# Patient Record
Sex: Female | Born: 1935 | Race: Black or African American | Hispanic: No | Smoking: Current every day smoker
Health system: Southern US, Community
[De-identification: ages and names within clinical notes are randomized; demographics above are authoritative.]

## PROBLEM LIST (undated history)

## (undated) DIAGNOSIS — I10 Essential (primary) hypertension: Secondary | ICD-10-CM

## (undated) DIAGNOSIS — Z8669 Personal history of other diseases of the nervous system and sense organs: Secondary | ICD-10-CM

## (undated) DIAGNOSIS — K5790 Diverticulosis of intestine, part unspecified, without perforation or abscess without bleeding: Secondary | ICD-10-CM

## (undated) DIAGNOSIS — Z9889 Other specified postprocedural states: Secondary | ICD-10-CM

## (undated) DIAGNOSIS — E785 Hyperlipidemia, unspecified: Secondary | ICD-10-CM

## (undated) DIAGNOSIS — K219 Gastro-esophageal reflux disease without esophagitis: Secondary | ICD-10-CM

## (undated) DIAGNOSIS — K5909 Other constipation: Secondary | ICD-10-CM

## (undated) HISTORY — DX: Personal history of other diseases of the nervous system and sense organs: Z86.69

## (undated) HISTORY — PX: OTHER SURGICAL HISTORY: SHX169

## (undated) HISTORY — PX: APPENDECTOMY: SHX54

## (undated) HISTORY — PX: ANKLE SURGERY: SHX546

## (undated) HISTORY — DX: Diverticulosis of intestine, part unspecified, without perforation or abscess without bleeding: K57.90

## (undated) HISTORY — DX: Hyperlipidemia, unspecified: E78.5

## (undated) HISTORY — DX: Other constipation: K59.09

## (undated) HISTORY — DX: Other specified postprocedural states: Z98.890

## (undated) HISTORY — DX: Essential (primary) hypertension: I10

## (undated) HISTORY — PX: TRIGGER FINGER RELEASE: SHX641

## (undated) HISTORY — DX: Gastro-esophageal reflux disease without esophagitis: K21.9

---

## 1983-04-28 HISTORY — PX: BACK SURGERY: SHX140

## 1988-12-26 HISTORY — PX: TOTAL HIP ARTHROPLASTY: SHX124

## 2005-06-06 ENCOUNTER — Inpatient Hospital Stay (HOSPITAL_COMMUNITY): Admission: EM | Admit: 2005-06-06 | Discharge: 2005-06-10 | Payer: Self-pay | Admitting: Emergency Medicine

## 2009-11-15 ENCOUNTER — Inpatient Hospital Stay (HOSPITAL_COMMUNITY): Admission: EM | Admit: 2009-11-15 | Discharge: 2009-11-18 | Payer: Self-pay | Admitting: Emergency Medicine

## 2009-12-03 ENCOUNTER — Emergency Department (HOSPITAL_COMMUNITY): Admission: EM | Admit: 2009-12-03 | Discharge: 2009-12-03 | Payer: Self-pay | Admitting: Emergency Medicine

## 2010-03-05 ENCOUNTER — Emergency Department (HOSPITAL_COMMUNITY): Admission: EM | Admit: 2010-03-05 | Discharge: 2010-03-05 | Payer: Self-pay | Admitting: Emergency Medicine

## 2010-03-11 ENCOUNTER — Ambulatory Visit: Payer: Self-pay | Admitting: Gastroenterology

## 2010-03-11 ENCOUNTER — Encounter: Payer: Self-pay | Admitting: Gastroenterology

## 2010-03-11 DIAGNOSIS — K625 Hemorrhage of anus and rectum: Secondary | ICD-10-CM

## 2010-03-11 DIAGNOSIS — K5900 Constipation, unspecified: Secondary | ICD-10-CM | POA: Insufficient documentation

## 2010-03-11 DIAGNOSIS — K6289 Other specified diseases of anus and rectum: Secondary | ICD-10-CM

## 2010-03-18 ENCOUNTER — Ambulatory Visit: Payer: Self-pay | Admitting: Internal Medicine

## 2010-03-18 ENCOUNTER — Encounter: Payer: Self-pay | Admitting: Gastroenterology

## 2010-03-19 ENCOUNTER — Ambulatory Visit (HOSPITAL_COMMUNITY)
Admission: RE | Admit: 2010-03-19 | Discharge: 2010-03-19 | Payer: Self-pay | Source: Home / Self Care | Admitting: Psychiatry

## 2010-03-27 DIAGNOSIS — K5790 Diverticulosis of intestine, part unspecified, without perforation or abscess without bleeding: Secondary | ICD-10-CM

## 2010-03-27 DIAGNOSIS — Z9889 Other specified postprocedural states: Secondary | ICD-10-CM

## 2010-03-27 HISTORY — DX: Diverticulosis of intestine, part unspecified, without perforation or abscess without bleeding: K57.90

## 2010-03-27 HISTORY — DX: Other specified postprocedural states: Z98.890

## 2010-04-04 ENCOUNTER — Ambulatory Visit (HOSPITAL_COMMUNITY)
Admission: RE | Admit: 2010-04-04 | Discharge: 2010-04-04 | Payer: Self-pay | Source: Home / Self Care | Attending: Gastroenterology | Admitting: Gastroenterology

## 2010-04-04 HISTORY — PX: COLONOSCOPY: SHX5424

## 2010-05-15 ENCOUNTER — Encounter (INDEPENDENT_AMBULATORY_CARE_PROVIDER_SITE_OTHER): Payer: Self-pay | Admitting: *Deleted

## 2010-05-27 ENCOUNTER — Encounter: Payer: Self-pay | Admitting: Urgent Care

## 2010-05-27 NOTE — Letter (Signed)
Summary: TCS ORDER  TCS ORDER   Imported By: Ave Filter 03/18/2010 11:07:15  _____________________________________________________________________  External Attachment:    Type:   Image     Comment:   External Document

## 2010-05-27 NOTE — Letter (Signed)
Summary: ABD X-RAY ORDER  ABD X-RAY ORDER   Imported By: Ave Filter 03/18/2010 16:12:53  _____________________________________________________________________  External Attachment:    Type:   Image     Comment:   External Document

## 2010-05-27 NOTE — Letter (Signed)
Summary: BOWEL PREP Victoria Klein  BOWEL PREP W/MIRALAX   Imported By: Rexene Alberts 03/11/2010 12:23:47  _____________________________________________________________________  External Attachment:    Type:   Image     Comment:   External Document  Appended Document: BOWEL PREP W/MIRALAX Pt needs to start clear liquids after lunch on 11/20. Please call her and let her know.  Appended Document: BOWEL PREP W/MIRALAX Discussed with [facility]. No need to start clear liquids. Patient has f/u OV for next week. Plans to schedule TCS at that time. Thanks.  Appended Document: 1WK F/U PER LL RECTAL PAIN/CONSTIPATION/LAW Please have pt get an abdominal xray (1 view) today. Reason: obstipation thanks anna   Appended Document: 1WK F/U PER LL RECTAL PAIN/CONSTIPATION/LAW Called pt and she stated she will go tomorrow. I faxed order to Radiology.

## 2010-05-29 NOTE — Letter (Addendum)
Summary: Recall Office Visit  [facility]  [address]   [place], [place] [address]   Phone: [phone]  Fax: [phone]      May 15, 2010   SHIVONNE SCHWARTZMAN [address] [place], [place]  [address] [birth year]   Dear Ms. Hector,   According to our records, it is time for you to schedule a follow-up office visit with [place].   At your convenience, please call [phone] to schedule an office visit. If you have any questions, concerns, or feel that this letter is in error, we would appreciate your call.   Sincerely,    Diana Eves  [facility] Ph: [phone]   Fax: [phone]  Appended Document: Recall Office Visit pt has appt for 06/17/10 @ 11am w/KJ. pt asked to mail her appt card

## 2010-05-29 NOTE — Assessment & Plan Note (Signed)
Summary: 1WK F/U PER LL RECTAL PAIN/CONSTIPATION/LAW   Visit Type:  Follow-up Visit Primary Care Provider:   Dr. Brion Aliment in [place]   CC:  F/U rectal pain/constipation.  History of Present Illness: Victoria Klein presents today for a one week follow-up. She was seen last week after presenting to [facility] ED secondary to rectal pain and bleeding, constipation. heme+ on DRE. had soft impaction noted last visit, refused to be disimpacted. Given Miralax prep, reportedly took all except fleets enema portion. Continued rectal pain, urge for BM. Has had small amounts of stool pass daily, but she reports still feels impacted. Denies abdominal pain.    Current Medications (verified): 1)  Hydrochlorothiazide 25 Mg Tabs (Hydrochlorothiazide) .... Once Daily 2)  Niaspan 500 Mg Cr-Tabs (Niacin (Antihyperlipidemic)) .... Once Daily 3)  Pravachol 20 Mg Tabs (Pravastatin Sodium) .... Once Daily 4)  Aspirin 325 Mg Tabs (Aspirin) .... Once Daily 5)  Fish Oil 1000 Mg Caps (Omega-3 Fatty Acids) .... Once Daily 6)  Polyethylene Glycol 3350  Powd (Polyethylene Glycol 3350) .Marland KitchenMarland KitchenMarland Kitchen 17 Grams By Mouth Daily For Constipation 7)  Polyethylene Glycol 3350  Powd (Polyethylene Glycol 3350) .... See Bowel Prep Instruction Sheet 8)  Lidazone Hc 3-0.5 % Crea (Lidocaine-Hydrocortisone Ace) .... Apply Anorectally Two Times A Day For Two Weeks  Allergies (verified): No Known Drug Allergies  Review of Systems General:  Denies fever, chills, and anorexia. Eyes:  Denies blurring, irritation, and discharge. ENT:  Denies sore throat, hoarseness, and difficulty swallowing. CV:  Denies chest pains and syncope. Resp:  Denies dyspnea at rest and wheezing. GI:  Complains of constipation; denies difficulty swallowing, pain on swallowing, and nausea. GU:  Denies urinary burning and blood in urine. MS:  Denies joint pain / LOM, joint swelling, and joint stiffness. Derm:  Denies rash, itching, and dry skin. Neuro:  Denies weakness  and syncope. Psych:  Denies depression and anxiety.  Vital Signs:  Patient profile:   77 year old female Height:      66 inches Weight:      178 pounds BMI:     28.83 Temp:     98.1 degrees F oral Pulse rate:   96 / minute BP sitting:   168 / 96  (left arm) Cuff size:   large  Vitals Entered By: Cloria Spring LPN (March 18, 2010 10:17 AM)  Physical Exam  General:  Well developed, well nourished, no acute distress. Lungs:  Clear throughout to auscultation. Heart:  Regular rate and rhythm; no murmurs, rubs,  or bruits. Abdomen:  Soft, nontender and nondistended. No masses, hepatosplenomegaly or hernias noted. Normal bowel sounds. Rectal:  DRE: small skin tag, very limited evaluation of anal canal secondary to increased discomfort. able to appreciate possible impaction; however, pt was extremely uncomfortable.  Impression & Recommendations:  Problem # 1:  CONSTIPATION (ICD-50.22)  77 year old female with significant constipation, worsening over past 2-3 weeks, here for f/u after miralax prep with poor results. continued feeling of inadequate BMs, small amount produced. +rectal pain, discomfort with defecation. No abdominal pain or bloating. No further rectal bleeding. difficult to assess degree of impaction secondary to painful DRE. likely rectal pain r/t impaction, possible anal fissure, possible rectal prolapse.  Will obtain abd xray to assess due to obstipation Set up for TCS with Dr. Darrick Penna   Orders: Est. Patient Level III [phone])  Problem # 2:  ANAL OR RECTAL PAIN (UEA-540.98)  see #1  Orders: Est. Patient Level III (11914)  Problem # 3:  RECTAL  BLEEDING (ICD-569.3)  see #1  Orders: Est. Patient Level III (47425)   Appended Document: 1WK F/U PER LL RECTAL PAIN/CONSTIPATION/LAW Please have pt get an abdominal xray (1 view) today. Reason: obstipation thanks Victoria Klein   Appended Document: 1WK F/U PER LL RECTAL PAIN/CONSTIPATION/LAW Called pt and she stated she  will go tomorrow. I faxed order to Radiology.

## 2010-05-29 NOTE — Assessment & Plan Note (Signed)
Summary: rectal bleeding.hemorrhoids.constipated/ss   Visit Type:  consult Referring Provider:  [facility] Primary Care Provider:   Dr. Brion Aliment in [place]   CC:  rectal bleeding, hemorroids, and constipation.  History of Present Illness: Victoria Klein is a pleasnat 77 y/o AA female who presents for further evaluation of rectal bleeding, rectal pain, constipation at the request of Dr. Donnetta Hutching at [facility] ED.  H/O intermittent constipation off/on for years but nothing "real bad". Two weeks of started to have bad constipation. Has not had "good" BM in 2 weeks. Went to ED on 03/05/10 for rectal pain and rectal bleeding. DRE showed heme positive stool but no other reported abnormality. Her H/H was 11.7/36.2, MCV 78.8. In 7/11 her folate was 9.3, B12 >2000. She was given Colace (took for few days but stopped). Was not able to try anusol supp due to pain. Drank bottle of Mag Citrate last Thursday and no results. No stool softners this week. No other laxatives.  Has had blood in stool and on toliet paper along with small clots. Some blood (little) still this morning in stool. Lots of rectal pain. Has constant urge to have BM but only little comes out and associated with worsening rectal pain. Hard to sit due to pain. Up and down all night last night due to urge but no results. No abd pain. No appetite. No vomiting. No heartburn or dysphagia. Intentional weight loss approx 30 pounds or more.  Current Medications (verified): 1)  Hydrochlorothiazide 25 Mg Tabs (Hydrochlorothiazide) .... Once Daily 2)  Niaspan 500 Mg Cr-Tabs (Niacin (Antihyperlipidemic)) .... Once Daily 3)  Pravachol 20 Mg Tabs (Pravastatin Sodium) .... Once Daily 4)  Aspirin 325 Mg Tabs (Aspirin) .... Once Daily 5)  Fish Oil 1000 Mg Caps (Omega-3 Fatty Acids) .... Once Daily  Allergies (verified): No Known Drug Allergies  Past History:  Past Medical History: H/O Bell's palsy GERD, no longer on  medications Hyperlipidemia Hypertension  Past Surgical History: Repair of the right ankle.  Back surgery 1985.  Appendectomy.  Trigger release surgery.  Tubal ligation.  Total hip arthroplasty in the 1990s.   Family History: Mom is deceased with a history of coronary artery disease and diabetes.  Dad deceased with a history of prostate cancer. Daughter had breast cancer, deceased age 79. No FH of CRC.   Social History: Smoked half a pack of cigarettes per day for 37 yrs, quit in 7/11.  Does not drink.  No illicit drug use.  She used to work at [employer].  Has a supportive family.    Review of Systems General:  Denies fever, chills, sweats, anorexia, fatigue, weakness, and weight loss. Eyes:  Denies vision loss. ENT:  Denies sore throat, hoarseness, and difficulty swallowing. CV:  Denies chest pains, angina, palpitations, dyspnea on exertion, and peripheral edema. Resp:  Denies dyspnea at rest, dyspnea with exercise, cough, sputum, and wheezing. GI:  See HPI. GU:  Denies urinary burning and blood in urine. MS:  Denies joint pain / LOM. Derm:  Denies rash and itching. Neuro:  Denies weakness, frequent headaches, memory loss, and confusion. Psych:  Denies depression and anxiety. Endo:  Denies unusual weight change. Heme:  Denies bruising and bleeding. Allergy:  Denies hives and rash.  Vital Signs:  Patient profile:   77 year old female Height:      66 inches Weight:      178 pounds BMI:     28.83 Temp:     97.6 degrees F oral  Pulse rate:   92 / minute BP sitting:   158 / 100  (left arm) Cuff size:   regular  Vitals Entered By: Hendricks Limes LPN (March 11, 2010 10:57 AM)  Physical Exam  General:  Well developed, well nourished, appears uncomfortable. Head:  Normocephalic and atraumatic. Eyes:  Conjunctivae pink, no scleral icterus.  Mouth:  Oropharyngeal mucosa moist, pink.  No lesions, erythema or exudate.    Neck:  Supple; no masses or thyromegaly. Lungs:   Clear throughout to auscultation. Heart:  Regular rate and rhythm; no murmurs, rubs,  or bruits. Abdomen:  Bowel sounds normal.  Abdomen is soft, nontender, nondistended.  No rebound or guarding.  No hepatosplenomegaly, masses or hernias.  No abdominal bruits.  Rectal:  Small skin tag externally. Moderate tenderness with internal exam. Moderate amount of soft stool in rectal vault. Tried to disimpact patient, but not tolerated by patient. No visible blood noted. Extremities:  No clubbing, cyanosis, edema or deformities noted. Neurologic:  Alert and  oriented x4;  grossly normal neurologically. Skin:  Intact without significant lesions or rashes. Cervical Nodes:  No significant cervical adenopathy. Psych:  Alert and cooperative. Normal mood and affect.  Impression & Recommendations:  Problem # 1:  CONSTIPATION (ICD-564.00)  Significant constipation associated with rectal pain and rectal bleeding. Soft impaction on exam. Patient has not taken any laxatives in five days. No abd pain. Discussed with Dr. Darrick Penna. Will start bowel prep today to relieve constipation. Patient refused digital disimpaction. See patient instructions. If no relief and pain worsens, she should go to ED or PCP.  OV next week. Will schedule TCS at that time.  Orders: Consultation Level IV ([address])  Problem # 2:  ANAL OR RECTAL PAIN (UEA-540.98)  Rectal pain likely due to impaction +/- anal fissure. Start lidocaine/hydrocortisone cream. Management of constipation as above. TCS in near future. Will schedule after next weeks OV.   Orders: Consultation Level IV (119[address])  Problem # 3:  RECTAL BLEEDING (ICD-569.3)  see #1 and #2.   Orders: Consultation Level IV ([address])  Patient Instructions: 1)  Soft diet only until bowels are moving.  2)  Miralax prep (see separate sheet). Need to start as soon as possible.  3)  You will also continue Miralax one capful daily for constipation. 4)  You may take ibuprofen, two every 8  hours for next three days for pain. 5)  You may also use Tylenol (acetaminophen) as per box instructions for additional pain relief if needed. 6)  Strong pain medication should not be given/taken as it will worsen the constipation. 7)  Start lidocaine/hydrocortisone for your rectal pain, see RX. 8)  Take hot bath (Sitz bath) for rectal pain relief. 9)  Office visit in one week. We will schedule you for a colonoscopy at that time. 10)  The medication list was reviewed and reconciled.  All changed / newly prescribed medications were explained.  A complete medication list was provided to the patient / caregiver. Prescriptions: LIDAZONE HC 3-0.5 % CREA (LIDOCAINE-HYDROCORTISONE ACE) apply anorectally two times a day for two weeks  #2 weeks x 0   Entered and Authorized by:   Leanna Battles. Dixon Boos   Signed by:   Leanna Battles Lewis PA-C on 03/11/2010   Method used:   Print then Give to Patient   RxID:   [id number] POLYETHYLENE GLYCOL 3350  POWD (POLYETHYLENE GLYCOL 3350) see bowel prep instruction sheet  #238 gram x 0   Entered and Authorized by:  Leanna Battles. Dixon Boos   Signed by:   Leanna Battles Lewis PA-C on 03/11/2010   Method used:   Print then Give to Patient   RxID:   [id number] POLYETHYLENE GLYCOL 3350  POWD (POLYETHYLENE GLYCOL 3350) 17 grams by mouth daily for constipation  #527 x 5   Entered and Authorized by:   Leanna Battles. Dixon Boos   Signed by:   Leanna Battles Lewis PA-C on 03/11/2010   Method used:   Print then Give to Patient   RxID:   [id number]  I would like to thank Dr. Adriana Simas for allowing [place] to take care in the part of this nice patient.   Appended Document: rectal bleeding.hemorrhoids.constipated/ss Differential diagnosis also includes rectal prolapse.  Appended Document: rectal bleeding.hemorrhoids.constipated/ss 1WK F/U IS IN THE COMPUTER

## 2010-06-04 NOTE — Medication Information (Signed)
Summary: DOCUSATE SODIUM 100MG   DOCUSATE SODIUM 100MG    Imported By: Rexene Alberts 05/27/2010 11:10:33  _____________________________________________________________________  External Attachment:    Type:   Image     Comment:   External Document  Appended Document: DOCUSATE SODIUM 100MG     Prescriptions: DOCUSATE SODIUM 100 MG CAPS (DOCUSATE SODIUM) 1 by mouth bid  #60 x 5   Entered and Authorized by:   Joselyn Arrow FNP-BC   Signed by:   Joselyn Arrow FNP-BC on 05/27/2010   Method used:   Electronically to        [facility] (retail)       [address]       [place], [place]  [address]       Ph: [phone]       Fax: [phone]   RxID:   [id number]

## 2010-06-17 ENCOUNTER — Ambulatory Visit: Payer: Self-pay | Admitting: Urgent Care

## 2010-07-04 ENCOUNTER — Encounter: Payer: Self-pay | Admitting: Urgent Care

## 2010-07-08 LAB — DIFFERENTIAL
Lymphs Abs: 2 10*3/uL (ref 0.7–4.0)
Monocytes Absolute: 0.7 10*3/uL (ref 0.1–1.0)
Monocytes Relative: 8 % (ref 3–12)
Neutro Abs: 5.3 10*3/uL (ref 1.7–7.7)
Neutrophils Relative %: 64 % (ref 43–77)

## 2010-07-08 LAB — CBC
HCT: 36.2 % (ref 36.0–46.0)
Hemoglobin: 11.7 g/dL — ABNORMAL LOW (ref 12.0–15.0)
RBC: 4.59 MIL/uL (ref 3.87–5.11)

## 2010-07-08 LAB — BASIC METABOLIC PANEL
CO2: 27 mEq/L (ref 19–32)
Calcium: 9.2 mg/dL (ref 8.4–10.5)
GFR calc Af Amer: >60 mL/min (ref >60)
GFR calc non Af Amer: >60 mL/min (ref >60)
Potassium: 3.1 mEq/L — ABNORMAL LOW (ref 3.5–5.1)
Sodium: 138 mEq/L (ref 135–145)

## 2010-07-12 LAB — DIFFERENTIAL
Basophils Absolute: 0 10*3/uL (ref 0.0–0.1)
Basophils Absolute: 0 10*3/uL (ref 0.0–0.1)
Basophils Relative: 0 % (ref 0–1)
Basophils Relative: 1 % (ref 0–1)
Eosinophils Absolute: 0.1 10*3/uL (ref 0.0–0.7)
Eosinophils Absolute: 0.2 10*3/uL (ref 0.0–0.7)
Eosinophils Absolute: 0.3 10*3/uL (ref 0.0–0.7)
Eosinophils Relative: 1 % (ref 0–5)
Eosinophils Relative: 2 % (ref 0–5)
Eosinophils Relative: 3 % (ref 0–5)
Lymphocytes Relative: 31 % (ref 12–46)
Lymphocytes Relative: 39 % (ref 12–46)
Lymphs Abs: 2.2 10*3/uL (ref 0.7–4.0)
Lymphs Abs: 3.1 10*3/uL (ref 0.7–4.0)
Lymphs Abs: 3.3 10*3/uL (ref 0.7–4.0)
Monocytes Absolute: 0.7 10*3/uL (ref 0.1–1.0)
Monocytes Absolute: 0.8 10*3/uL (ref 0.1–1.0)
Monocytes Absolute: 1 10*3/uL (ref 0.1–1.0)
Monocytes Relative: 8 % (ref 3–12)
Monocytes Relative: 8 % (ref 3–12)
Monocytes Relative: 9 % (ref 3–12)
Neutro Abs: 4.2 10*3/uL (ref 1.7–7.7)
Neutro Abs: 5.9 10*3/uL (ref 1.7–7.7)
Neutrophils Relative %: 49 % (ref 43–77)
Neutrophils Relative %: 59 % (ref 43–77)

## 2010-07-12 LAB — CBC
HCT: 35.2 % — ABNORMAL LOW (ref 36.0–46.0)
HCT: 35.7 % — ABNORMAL LOW (ref 36.0–46.0)
HCT: 38 % (ref 36.0–46.0)
Hemoglobin: 11.6 g/dL — ABNORMAL LOW (ref 12.0–15.0)
Hemoglobin: 12.4 g/dL (ref 12.0–15.0)
MCH: 25.9 pg — ABNORMAL LOW (ref 26.0–34.0)
MCH: 26.2 pg (ref 26.0–34.0)
MCHC: 32.6 g/dL (ref 30.0–36.0)
MCHC: 32.7 g/dL (ref 30.0–36.0)
MCHC: 33.2 g/dL (ref 30.0–36.0)
MCV: 79.2 fL (ref 78.0–100.0)
MCV: 80.3 fL (ref 78.0–100.0)
Platelets: 335 10*3/uL (ref 150–400)
RBC: 4.45 MIL/uL (ref 3.87–5.11)
RBC: 4.8 MIL/uL (ref 3.87–5.11)
RDW: 13.9 % (ref 11.5–15.5)
RDW: 13.9 % (ref 11.5–15.5)
WBC: 8.6 10*3/uL (ref 4.0–10.5)

## 2010-07-12 LAB — POCT CARDIAC MARKERS
CKMB, poc: 1.5 ng/mL (ref 1.0–8.0)
CKMB, poc: 1.8 ng/mL (ref 1.0–8.0)
Myoglobin, poc: 92.5 ng/mL (ref 12–200)
Myoglobin, poc: 94.8 ng/mL (ref 12–200)
Troponin i, poc: <0.05 ng/mL (ref 0.00–0.09)
Troponin i, poc: <0.05 ng/mL (ref 0.00–0.09)

## 2010-07-12 LAB — URINALYSIS, ROUTINE W REFLEX MICROSCOPIC
Bilirubin Urine: NEGATIVE
Hgb urine dipstick: NEGATIVE
Ketones, ur: NEGATIVE mg/dL
Protein, ur: NEGATIVE mg/dL
Urobilinogen, UA: 0.2 mg/dL (ref 0.0–1.0)

## 2010-07-12 LAB — POCT I-STAT, CHEM 8
BUN: 6 mg/dL (ref 6–23)
Chloride: 100 mEq/L (ref 96–112)
Creatinine, Ser: 0.8 mg/dL (ref 0.4–1.2)
Hemoglobin: 13.9 g/dL (ref 12.0–15.0)
Potassium: 2.8 mEq/L — ABNORMAL LOW (ref 3.5–5.1)
Sodium: 139 mEq/L (ref 135–145)

## 2010-07-12 LAB — BASIC METABOLIC PANEL
BUN: 6 mg/dL (ref 6–23)
BUN: 7 mg/dL (ref 6–23)
BUN: 9 mg/dL (ref 6–23)
CO2: 25 mEq/L (ref 19–32)
CO2: 28 mEq/L (ref 19–32)
Calcium: 8.9 mg/dL (ref 8.4–10.5)
Calcium: 8.9 mg/dL (ref 8.4–10.5)
Chloride: 101 mEq/L (ref 96–112)
Chloride: 102 mEq/L (ref 96–112)
Creatinine, Ser: 0.67 mg/dL (ref 0.4–1.2)
Creatinine, Ser: 0.69 mg/dL (ref 0.4–1.2)
GFR calc Af Amer: >60 mL/min (ref >60)
GFR calc Af Amer: >60 mL/min (ref >60)
GFR calc non Af Amer: >60 mL/min (ref >60)
GFR calc non Af Amer: >60 mL/min (ref >60)
GFR calc non Af Amer: >60 mL/min (ref >60)
Glucose, Bld: 118 mg/dL — ABNORMAL HIGH (ref 70–99)
Glucose, Bld: 129 mg/dL — ABNORMAL HIGH (ref 70–99)
Glucose, Bld: 130 mg/dL — ABNORMAL HIGH (ref 70–99)
Potassium: 2.9 mEq/L — ABNORMAL LOW (ref 3.5–5.1)
Potassium: 3.7 mEq/L (ref 3.5–5.1)
Potassium: 3.8 mEq/L (ref 3.5–5.1)
Sodium: 133 mEq/L — ABNORMAL LOW (ref 135–145)
Sodium: 137 mEq/L (ref 135–145)

## 2010-07-12 LAB — LIPID PANEL
Cholesterol: 177 mg/dL (ref 0–200)
HDL: 43 mg/dL (ref >39)
LDL Cholesterol: 107 mg/dL — ABNORMAL HIGH (ref 0–99)
Total CHOL/HDL Ratio: 4.1 RATIO
Triglycerides: 133 mg/dL (ref <150)
VLDL: 27 mg/dL (ref 0–40)

## 2010-07-12 LAB — TSH: TSH: 1.266 u[IU]/mL (ref 0.350–4.500)

## 2010-07-12 LAB — URINE CULTURE

## 2010-07-12 LAB — PROTIME-INR: INR: 1.08 (ref 0.00–1.49)

## 2010-07-12 LAB — VITAMIN B12: Vitamin B-12: >2000 pg/mL — ABNORMAL HIGH (ref 211–911)

## 2010-07-14 ENCOUNTER — Encounter: Payer: Self-pay | Admitting: Urgent Care

## 2010-07-14 ENCOUNTER — Ambulatory Visit (INDEPENDENT_AMBULATORY_CARE_PROVIDER_SITE_OTHER): Payer: Medicare Other | Admitting: Urgent Care

## 2010-07-14 DIAGNOSIS — K6289 Other specified diseases of anus and rectum: Secondary | ICD-10-CM

## 2010-07-14 DIAGNOSIS — K5900 Constipation, unspecified: Secondary | ICD-10-CM

## 2010-07-24 NOTE — Assessment & Plan Note (Signed)
Summary: rectal pain   Vital Signs:  Patient profile:   77 year old female Height:      68 inches Weight:      191 pounds BMI:     29.15 Temp:     97.9 degrees F oral Pulse rate:   88 / minute BP sitting:   148 / 102  (left arm)  Vitals Entered By: Carolan Clines LPN (July 14, 2010 1:05 PM)  Visit Type:  Follow-up Visit Primary Care Provider:  [facility]  Chief Complaint:  FU fissure/constipation.  History of Present Illness:  77 year old black female here for followup constipation and proctalgia.  She has history of fissure.  Doing well except still w/ constipation BM q2 but only if she takes something.  Colace 2 by mouth two times a day.  MIralax did not help.  Trying fleets enemas q 2-3 days x 2-3 weeks.  Small amt dull red blood on occasion.  Some proctalgia.  Lots of straining w/ hard stools.  Wt up 13# in past 6 mo.   Overall feels well.   Denies any fever or chills.  Current Medications (verified): 1)  Hydrochlorothiazide 25 Mg Tabs (Hydrochlorothiazide) .... Once Daily 2)  Niaspan 500 Mg Cr-Tabs (Niacin (Antihyperlipidemic)) .... Once Daily 3)  Pravachol 20 Mg Tabs (Pravastatin Sodium) .... Once Daily 4)  Aspirin 325 Mg Tabs (Aspirin) .... Once Daily 5)  Fish Oil 1000 Mg Caps (Omega-3 Fatty Acids) .... Once Daily 6)  Polyethylene Glycol 3350  Powd (Polyethylene Glycol 3350) .Marland KitchenMarland KitchenMarland Kitchen 17 Grams By Mouth Daily For Constipation 7)  Polyethylene Glycol 3350  Powd (Polyethylene Glycol 3350) .... See Bowel Prep Instruction Sheet 8)  Lidazone Hc 3-0.5 % Crea (Lidocaine-Hydrocortisone Ace) .... Apply Anorectally Two Times A Day For Two Weeks 9)  Docusate Sodium 100 Mg Caps (Docusate Sodium) .Marland Kitchen.. 1 By Mouth Bid  Allergies (verified): No Known Drug Allergies  Past History:  Past Surgical History: Last updated: 03/11/2010 Repair of the right ankle.  Back surgery 1985.  Appendectomy.  Trigger release surgery.  Tubal ligation.  Total hip arthroplasty in  the 1990s.   Past Medical History: H/O Bell's palsy GERD, no longer on medications Hyperlipidemia Hypertension chronic constipation 12/11 colonoscopy Dr Georgiann Mohs fissure, exacerbated by constipation.   Possible nonsteroidal antiinflammatory drug-induced colitis, Mild diverticulosis    Review of Systems      See HPI General:  Denies fever, chills, sweats, anorexia, fatigue, weakness, malaise, weight loss, and sleep disorder. CV:  Denies chest pains, angina, palpitations, syncope, dyspnea on exertion, orthopnea, PND, peripheral edema, and claudication. Resp:  Denies dyspnea at rest, dyspnea with exercise, cough, sputum, wheezing, coughing up blood, and pleurisy. GI:  See HPI; Denies difficulty swallowing, pain on swallowing, indigestion/heartburn, vomiting blood, and fecal incontinence. GU:  Denies urinary burning, blood in urine, nocturnal urination, urinary frequency, urinary incontinence, and abnormal vaginal bleeding. MS:  Denies joint pain / LOM, joint swelling, joint stiffness, joint deformity, low back pain, muscle weakness, muscle cramps, muscle atrophy, leg pain at night, leg pain with exertion, and shoulder pain / LOM hand / wrist pain (CTS). Derm:  Denies rash, itching, dry skin, hives, moles, warts, and unhealing ulcers. Psych:  Denies depression, anxiety, memory loss, suicidal ideation, hallucinations, paranoia, phobia, and confusion. Heme:  Denies bruising, bleeding, and enlarged lymph nodes.  Physical Exam  General:  Well developed, well nourished, no acute distress. Head:  Normocephalic and atraumatic. Eyes:  Conjunctivae pink, no scleral icterus.  Mouth:  Oropharyngeal  mucosa moist, pink.  No lesions, erythema or exudate.    Neck:  Supple; no masses or thyromegaly. Lungs:  Clear throughout to auscultation. Heart:  Regular rate and rhythm; no murmurs, rubs,  or bruits. Abdomen:  Soft, nontender and nondistended. No masses, hepatosplenomegaly or hernias noted.  Normal bowel sounds.without guarding and without rebound.   Msk:  Symmetrical with no gross deformities. Normal posture. Extremities:  No clubbing, cyanosis, edema or deformities noted. Neurologic:  Alert and  oriented x4;  grossly normal neurologically. Skin:  Intact without significant lesions or rashes. Cervical Nodes:  No significant cervical adenopathy. Psych:  Alert and cooperative. Normal mood and affect.   Impression & Recommendations:  Problem # 1:  CONSTIPATION (ICD-564.00)  Ms. Mendel is a 77 year old black female with history of chronic constipation. She has had poor results with MiraLax. She is continued to take stool softeners several times per day. She is having to use fleets enema on a regular basis. We did discuss the fact that I do not want her taking fleets enemas every couple days. We will give a trial of Amitiza.  Orders: Est. Patient Level III (41324)  Problem # 2:  ANAL OR RECTAL PAIN (MWN-027.25) History of fissure occasional intermittent pain noted.   Suspect secondary to this versus hemorrhoids.  Patient Instructions: 1)   Discontinue MiraLax.  2)  Amitiza 24 micrograms one daily to begin may increase to twice a day with food as needed for constipation ( 2 boxes given) 3)   May continue Colace 100 mg twice a day. 4)  Please schedule a follow-up appointment in 6 to 8 weeks.  Prescriptions: ANUSOL-HC 25 MG SUPP (HYDROCORTISONE ACETATE) 1 PR two times a day x 10 days  #20 x 0   Entered and Authorized by:   Joselyn Arrow FNP-BC   Signed by:   Joselyn Arrow FNP-BC on 07/14/2010   Method used:   Electronically to        [facility] (retail)       [address]       [place], [place]  [address]       Ph: [phone]       Fax: [phone]   RxID:   [id number] AMITIZA 24 MCG CAPS (LUBIPROSTONE) 1 by mouth two times a day as needed constipation w/ food  #62 x 5   Entered and Authorized by:   Joselyn Arrow FNP-BC    Signed by:   Joselyn Arrow FNP-BC on 07/14/2010   Method used:   Electronically to        [facility] (retail)       [address]       [place], [place]  [address]       Ph: [phone]       Fax: [phone]   RxID:   [id number]    Orders Added: 1)  Est. Patient Level III [694[address]]  Appended Document: rectal pain F/U OPV IS IN THE COMPUTER

## 2010-09-03 ENCOUNTER — Ambulatory Visit: Payer: Medicare Other | Admitting: Gastroenterology

## 2010-09-12 NOTE — H&P (Signed)
Victoria Klein, Victoria Klein              ACCOUNT NO.:  [id number]   MEDICAL RECORD NO.:  [id number]          PATIENT TYPE:  EMS   LOCATION:  ED                            FACILITY:  [facility]   PHYSICIAN:  J. Darreld Mclean, M.D. DATE OF BIRTH:  03/29/1932   DATE OF ADMISSION:  06/06/2005  DATE OF DISCHARGE:  LH                                HISTORY & PHYSICAL   CHIEF COMPLAINT:  I fell down some steps and hurt my ankle.   The patient is a 77 year old female who fell down some steps this evening  and hurt her ankle on the right.  She had a displaced trimalleolar fracture  with large posterior fragment.  Reduced in the ER by Dr. Rosalia Hammers.  Post  reduction x-rays look very good.  No other injuries.   PAST MEDICAL HISTORY:  Positive for hypertension and gastroesophageal reflux  disease.  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   PREVIOUS SURGERIES:  1.  Back surgery in 1985.  2.  Appendectomy as a child.  3.  Bilateral tubal ligation many years ago.  4.  Two total hip procedures in 1990, early 1990 and late 1990, one on each      side.  5.  Trigger thumb release on the left hand by me many years ago.   Dr. Bethena Midget is her family doctor.  The patient lives in [place].   General medical health is otherwise good.   PHYSICAL EXAMINATION:  VITAL SIGNS:  Normal.  GENERAL:  She is alert, cooperative, and oriented.  HEENT: Negative.  NECK: Supple.  LUNGS: Clear to percussion and auscultation.  HEART:  Regular without murmur heard.  ABDOMEN:  Soft, nontender, without masses.  EXTREMITIES:  The right leg is in a cast.  Neurovascular is intact,  posterior splint.  Other extremities within normal limits.  CNS: Intact.  SKIN:  Intact.   IMPRESSION:  1.  Trimalleolar fracture on the right with large posterior fragment.  2.  Status post bilateral hip replacements.  3.  History of gastroesophageal reflux disease.  4.  History of hypertension.   PLAN:  I will have the hospitalists see the  patient.  Discussed with patient  the planned procedure, risks, and imponderables including infection,  stiffness of the ankle, traumatic arthritis, need for anesthesia,  recommended spinal anesthesia, physical therapy, and possible embolization.  The patient appears to understand and agrees to proceed as outlined.  Labs  are pending.                                            ______________________________  J. Darreld Mclean, M.D.     JWK/MEDQ  D:  06/06/2005  T:  06/06/2005  Job:  [id number]

## 2010-09-12 NOTE — Consult Note (Signed)
Victoria Klein, Victoria Klein              ACCOUNT NO.:  [id number]   MEDICAL RECORD NO.:  [id number]          PATIENT TYPE:  INP   LOCATION:  A331                          FACILITY:  [facility]   PHYSICIAN:  Osvaldo Shipper, MD     DATE OF BIRTH:  [birth year]   DATE OF CONSULTATION:  06/06/2005  DATE OF DISCHARGE:                                   CONSULTATION   MEDICAL CONSULTATION:   DATE OF CONSULTATION:  June 06, 2005   REQUESTING PHYSICIAN:  Dr. Hilda Lias   REASON FOR CONSULTATION:  Medical management and preoperative evaluation.   PRIMARY MEDICAL DOCTOR:  Dr. Bethena Midget at [place].   CHIEF COMPLAINT:  Right ankle pain.   HISTORY OF PRESENT ILLNESS:  The patient is a 77 year old African-American  female who has history of hypertension, dyslipidemia and acid reflux disease  who was doing well today when she was going down her stairs to pick up  groceries from her car when she missed a step and fell down.  She twisted  her right foot and as a result has a fracture of her right ankle.  This has  been described as a trimalleolar fracture with dislocation.  The patient  denied any syncopal episode prior to this event or after this event.  Denied  any chest pain.  Denied any shortness of breath.  Apart from that the  patient does not have any complaints.  She gives history of some nonspecific  chest pain which occurred about a year ago when she was on the treadmill  otherwise she is not able to describe this fully.  Since then she has not  had any pain.  She climbs this flight of stairs in her house with no  difficulties and does not get any chest symptoms with that.  She gives  history of a stress test about 3 years ago which was described as being  negative.  She describes herself as a fairly active person but is not on any  exercise regimen.  She does not need a cane or a walker to ambulate.   MEDICATIONS:  Medications at home include pravastatin, Prevacid,  hydrochlorothiazide, and aspirin 325 mg.   ALLERGIES:  No known drug allergies.   PAST MEDICAL HISTORY:  Past medical history significant for hypertension,  dyslipidemia, GERD, Bell's palsy about 20 years ago.  Surgical history  includes back surgery for some disk problems.  Bilateral hip replacement  surgeries.  Tubal ligation and an appendectomy in the past.  No history of  any heart disease or diabetes.  No history of strokes. Per patient, negative  stress test 3 yrs ago.   SOCIAL HISTORY:  The patient lives in [place] with her grandchildren and  daughter.  She does not need a cane or walker to ambulate.  She smokes about  half a pack of cigarettes per day for 30 odd years.  No alcohol use, no  illicit drug use.  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   FAMILY HISTORY:  Mother had coronary artery disease, hypertension, and  father died of prostate cancer.   REVIEW OF SYSTEMS:  Ten-point review of systems is unremarkable except as  mentioned in the HPI.   PHYSICAL EXAMINATION:  VITAL SIGNS:  Temperature is 97.3, heart rate 88,  respiratory rate 14, blood pressure 154/88.  GENERAL EXAM:  This is a well-developed, well-nourished individual in no  apparent distress very pleasant to talk to.  HEENT:  There is no pallor, no icterus.  Oral mucous membranes moist.  No  oral lesions are seen.  NECK:  Neck is soft and supple.  No thyromegaly appreciated.  LUNGS:  Lungs are clear to auscultation bilaterally.  No wheezes, rales, or  rhonchi.  CARDIOVASCULAR:  S1, S2 is normal, regular, no murmurs, no S3, S4, no rubs.  No carotid bruits heard.  ABDOMEN:  Abdomen is soft, nontender, nondistended, bowel sounds present.  No mass or organomegaly appreciated.  EXTREMITIES:  The right lower extremity is in bandage and is not examined.  Left lower extremity reveals no edema.  Peripheral pulses are palpable.   LABORATORY DATA:  ABG was done - 7.40, 44,  64, 27, 93%.  CBC showed elevated  white count 13,000 with some neutrophilia.  Hemoglobin 12.9, platelet count  365.  ESR 26.  PT/INR normal.  PTT normal.  Complete metabolic profile shows  a glucose of 100 otherwise unremarkable.  UA completely unremarkable.   Chest x-ray shows no active disease.   EKG was done which shows sinus rhythm with left axis deviation.  Lead III  shows T wave inversion.  Possible Q wave, it is difficult to see if there is  an upward deflection or not prior to this wave.  Possible upward deflection  in one of the waveforms.  Otherwise, I do not appreciate any other ST or T  wave changes on this EKG.  No Q waves seen in lead II or aVF.   IMPRESSION:  This is a 77 year old African-American female who has history  of hypertension, dyslipidemia, acid reflux disease who presents to the  emergency department with a fall and sustained a fracture of her right  ankle.  The patient has intermediate to minor clinical predictors.  She had  a stress test 3 years ago which was unremarkable as per the patient.  Her  functional status is moderate.  Surgical procedure she is going to have is  intermediate risk procedure.  Her blood pressure is mildly elevated at this  time.   RECOMMENDATIONS:  1.  Preoperative evaluation: Considering minor to intermediate clinical      predictors,  intermediate risk procedure and moderate functional      capacity the patient may proceed to surgery without further evaluation      at this time.  I would recommend peri-operative beta blockers on this      patient to keep her rate in the 60s.  Incentive spirometry will be      recommended considering patient's history of smoking.  DVT prophylaxis      will also be recommended and we will defer this to Dr. Hilda Lias to decide      on the timing.  2.  Hypertension.  We will continue her hydrochlorothiazide and add      metoprolol as discussed above.  We would like to thank Dr. Hilda Lias for asking [place]  to consult on this patient.  We will follow this patient along with him during her hospital stay.      Osvaldo Shipper, MD  Electronically Signed  GK/MEDQ  D:  06/06/2005  T:  06/06/2005  Job:  [id number]   cc:   Teola Bradley, M.D.  Fax: [phone]

## 2010-09-12 NOTE — Op Note (Signed)
Victoria Klein, Victoria Klein              ACCOUNT NO.:  [id number]   MEDICAL RECORD NO.:  [id number]          PATIENT TYPE:  INP   LOCATION:  A331                          FACILITY:  [facility]   PHYSICIAN:  J. Darreld Mclean, M.D. DATE OF BIRTH:  1932-02-22   DATE OF PROCEDURE:  06/07/2005  DATE OF DISCHARGE:                                 OPERATIVE REPORT   PREOPERATIVE DIAGNOSIS:  Trimalleolar fracture of the right ankle with large  posterior fragment.   POSTOPERATIVE DIAGNOSIS:  Trimalleolar fracture of the right ankle with  large posterior fragment.   PROCEDURE:  Open treatment internal reduction, right trimalleolar fracture  of the ankle in the prone position with fixation, also of the large  posterior fragment.   ANESTHESIA:  Spinal.   SURGEON:  J. Darreld Mclean, M.D.   TOURNIQUET TIME:  59 minutes no drains.   DRAINS:  None.   SPLINTS:  Posterior splint applied at the end of the procedure.   INDICATIONS:  The patient is a 77 year old female who fell yesterday and  injured her ankle. She had a trimalleolar fracture, displaced, with a large  posterior fragment.  The ER physician reduced the fracture before I saw the  patient and put her in a posterior splint.  The risks and imponderables of  the procedure were discussed preoperatively.  The patient appeared to  understand and agreed with the procedure as outlined.  The strong  possibility of posttraumatic arthritis has been explained as well as  infection and pulmonary embolism.   DESCRIPTION OF PROCEDURE:  The patient was seen in the holding area and the  right ankle was identified as the correct surgical site.  She placed a mark,  I placed a mark.  She was brought back to the operating room.  She was given  spinal anesthesia and then placed in the prone position.  Once we  ascertained that the position was good, she was prepped and draped in the  usual manner.  We had a time out, reidentifying the patient, doing the right  ankle as the correct surgical site.  Since she was prone, and the ankles are  reversed we made sure that the right ankle was the correct site.   The patient's was elevated, wrapped circumferentially with the Esmarch  bandage, and tourniquet inflated to 300 mmHg and Esmarch bandage removed.   Incisions were made first in the posterior area just lateral to the Achilles  tendon.  We went down with blunt dissection to the fracture site.  It was  reduced and held in place with a Zezur awl.  Small guide pins were placed  for the cannulated  __________.  I used the C-arm fluoroscopy used for  control.  He was placed parallel, over drilled with a 3.2 drill and then a  44-mm cannulated screw and then a 38-mm cannulated screw were inserted.  This gave excellent fixation of the posterior fragment with anatomical  reduction.  This was anatomical both by x-ray and by visualization.   Attention was directed to the medial side.  Using a small guidepin a 54-mm  cannulated  screw was placed with over drilling of the 3.2 cannulated drill  bit.  There was good anatomic position of the medial malleolus.  Attention  was directed to the lateral side.  The fracture was identified and removed a  hematoma; and a 6-0 plate was used.  A so-called whirly-bird fixation was  done to hold the plate in position and screw holds were made.  The most  distal screw was a 16 mm cancellous and the rest were cortical except for  the one locking screw.  Six screws were filled and completed.  This gave  excellent fixation laterally.  Permanent x-rays were taken and the ankle was  stable.   The wounds were then reapproximated using 2-0 chromic, 2-0 plain and then  skin staples.  The wounds were cleaned with Betadine and then a posterior  dressing and bulky dressings were applied to all the wounds.  The tourniquet  deflated after 59 minutes.  Sheet cotton applied and then a posterior splint  applied. Ace bandage applied loosely.   The patient tolerated the procedure  well and will go to recovery in good condition.           ______________________________  Shela Commons. Darreld Mclean, M.D.     JWK/MEDQ  D:  06/07/2005  T:  06/08/2005  Job:  [id number]   cc:   Dr. Rito Ehrlich -- Hospitalist

## 2010-09-12 NOTE — Discharge Summary (Signed)
Victoria Klein, Victoria Klein              ACCOUNT NO.:  [id number]   MEDICAL RECORD NO.:  [id number]          PATIENT TYPE:  INP   LOCATION:  A331                          FACILITY:  [facility]   PHYSICIAN:  J. Darreld Mclean, M.D. DATE OF BIRTH:  18-May-1931   DATE OF ADMISSION:  06/06/2005  DATE OF DISCHARGE:  LH                                 DISCHARGE SUMMARY   DISCHARGE DIAGNOSES:  Trimalleolar fracture of the right ankle.   PROCEDURES:  Open treatment internal reduction of the right ankle fracture  in the prone position.   CONDITION ON DISCHARGE:  Good.   DISPOSITION:  Home.   DISCHARGE MEDICATIONS:  1.  Vicodin ES one q.4 h p.r.n. pain.  2.  K-Ciel 10 mEq one p.o. b.i.d.   OTHER DIAGNOSES:  1.  Hypokalemia.  2.  Gastroesophageal reflux.  3.  Hypertension.   PLAN:  Home Health has been arranged for physical therapy.  The patient will  use a rolling walker, minimal weight to the right foot.   FOLLOWUP:  The patient will be seen in my office on June 22, 2005, at  10:30 in the morning.  X-rays at that time.   BRIEF HISTORY:  The patient was seen in the emergency room and the ankle was  identified with __________ in the fracture.  She was admitted.  Surgery was  performed the next morning.  She tolerated the procedure well.  Postoperatively, she had done very well.  However, potassium did drop to  2.9, and has been supplemented.  Other than that, she has done well, it has  been slow but steady progress on physical therapy.  She understands  restrictions.  Other labs were negative.  The patient will be seen in the  office__________contact with the office or hospital beeper system.                                            ______________________________  Shela Commons. Darreld Mclean, M.D.    JWK/MEDQ  D:  06/10/2005  T:  06/10/2005  Job:  [id number]

## 2010-11-11 ENCOUNTER — Ambulatory Visit (INDEPENDENT_AMBULATORY_CARE_PROVIDER_SITE_OTHER): Payer: Medicare Other | Admitting: Urgent Care

## 2010-11-11 ENCOUNTER — Encounter: Payer: Self-pay | Admitting: Urgent Care

## 2010-11-11 VITALS — BP 137/77 | HR 107 | Temp 98.0°F | Ht 67.0 in | Wt 198.2 lb

## 2010-11-11 DIAGNOSIS — K5900 Constipation, unspecified: Secondary | ICD-10-CM

## 2010-11-11 MED ORDER — LUBIPROSTONE 24 MCG PO CAPS: 24.0000 ug | ORAL_CAPSULE | Freq: Two times a day (BID) | ORAL | Status: DC

## 2010-11-11 NOTE — Progress Notes (Signed)
Primary Care Physician:  Rush Barer PA Primary Gastroenterologist:  Dr. Darrick Penna  Chief Complaint  Patient presents with  . Constipation    HPI:  Victoria Klein is a 77 y.o. female here for follow up chronic constipation.  Now w/ BM 1-2 per day since started on Amitiza BID.  Rare heartburn & indigestion.  Denies nausea, vomiting, dysphagia, odynophagia or anorexia.  Denies rectal bleeding, melena or weight loss.  Past Medical History  Diagnosis Date  . H/O: Bell's palsy   . GERD (gastroesophageal reflux disease)   . Hyperlipemia   . Hypertension   . Chronic constipation   . S/P colonoscopy 12/11    Dr Georgiann Mohs fissure, mild colitis  . Diverticulosis 12/11   Past Surgical History  Procedure Date  . Ankle surgery     right   . Back surgery 1985  . Appendectomy   . Trigger finger release   . Tubal ligation   . Total hip arthroplasty 1990's   Current Outpatient Prescriptions  Medication Sig Dispense Refill  . amLODipine (NORVASC) 10 MG tablet       . aspirin 325 MG tablet Take 325 mg by mouth daily.        . hydrochlorothiazide (,MICROZIDE/HYDRODIURIL,) 12.5 MG capsule       . lubiprostone (AMITIZA) 24 MCG capsule Take 1 capsule (24 mcg total) by mouth 2 (two) times daily with a meal.  62 capsule  5   Allergies as of 11/11/2010  . (No Known Allergies)   Review of Systems: Gen: Denies any fever, chills, sweats, anorexia, fatigue, weakness, malaise, weight loss, and sleep disorder CV: Denies chest pain, angina, palpitations, syncope, orthopnea, PND, peripheral edema, and claudication. Resp: Denies dyspnea at rest, dyspnea with exercise, cough, sputum, wheezing, coughing up blood, and pleurisy. GI: Denies vomiting blood, jaundice, and fecal incontinence.   Denies dysphagia or odynophagia. Derm: Denies rash, itching, dry skin, hives, moles, warts, or unhealing ulcers.  Psych: Denies depression, anxiety, memory loss, suicidal ideation, hallucinations, paranoia, and  confusion. Heme: Denies bruising, bleeding, and enlarged lymph nodes.  Physical Exam: BP 137/77  Pulse 107  Temp(Src) 98 F (36.7 C) (Temporal)  Ht 5\' 7"  (1.702 m)  Wt 198 lb 3.2 oz (89.903 kg)  BMI 31.04 kg/m2 General:   Alert,  Well-developed, well-nourished, pleasant and cooperative in NAD Head:  Normocephalic and atraumatic. Eyes:  Sclera clear, no icterus.   Conjunctiva pink. Mouth:  No deformity or lesions, dentition normal. Neck:  Supple; no masses or thyromegaly. Heart:  Regular rate and rhythm; no murmurs, clicks, rubs,  or gallops. Abdomen:  Soft, nontender and nondistended. No masses, hepatosplenomegaly or hernias noted. Normal bowel sounds, without guarding, and without rebound.   Msk:  Symmetrical without gross deformities. Normal posture. Pulses:  Normal pulses noted. Extremities:  Without clubbing or edema. Neurologic:  Alert and  oriented x4;  grossly normal neurologically. Skin:  Intact without significant lesions or rashes. Cervical Nodes:  No significant cervical adenopathy. Psych:  Alert and cooperative. Normal mood and affect.

## 2010-11-11 NOTE — Progress Notes (Signed)
Cc to PCP 

## 2010-11-11 NOTE — Assessment & Plan Note (Signed)
Constipation much better on amitiza.  Doing well.

## 2010-11-18 ENCOUNTER — Emergency Department (HOSPITAL_COMMUNITY): Payer: Medicare Other

## 2010-11-18 ENCOUNTER — Emergency Department (HOSPITAL_COMMUNITY)
Admission: EM | Admit: 2010-11-18 | Discharge: 2010-11-18 | Disposition: A | Discharge status: Home / Self Care | Payer: Medicare Other | Attending: Emergency Medicine | Admitting: Emergency Medicine

## 2010-11-18 ENCOUNTER — Encounter (HOSPITAL_COMMUNITY): Payer: Self-pay

## 2010-11-18 DIAGNOSIS — Z7982 Long term (current) use of aspirin: Secondary | ICD-10-CM | POA: Insufficient documentation

## 2010-11-18 DIAGNOSIS — I10 Essential (primary) hypertension: Secondary | ICD-10-CM | POA: Insufficient documentation

## 2010-11-18 DIAGNOSIS — M5430 Sciatica, unspecified side: Secondary | ICD-10-CM | POA: Insufficient documentation

## 2010-11-18 DIAGNOSIS — F17200 Nicotine dependence, unspecified, uncomplicated: Secondary | ICD-10-CM | POA: Insufficient documentation

## 2010-11-18 MED ORDER — OXYCODONE-ACETAMINOPHEN 5-325 MG PO TABS: 1.0000 | ORAL_TABLET | Freq: Four times a day (QID) | ORAL | Status: AC | PRN

## 2010-11-18 MED ORDER — METHOCARBAMOL 500 MG PO TABS: 500.0000 mg | ORAL_TABLET | Freq: Three times a day (TID) | ORAL | Status: AC

## 2010-11-18 NOTE — ED Notes (Signed)
Pt reports pain in buttocks radiating down both legs.  Denies injury.  Reports history of bilateral  hip replacements.

## 2010-11-18 NOTE — ED Notes (Signed)
Pt transported to radiology via stretcher 

## 2010-11-18 NOTE — ED Provider Notes (Signed)
History     Chief Complaint  Patient presents with  . Back Pain   HPI Comments: Patient c/o intermittent low back pain for several days.  States the pain is only present with standing and walking and resolves with sitting or lying down.  She denies fall, incontinence of bowel or bladder functions, dysuria or chest pain.    Patient is a 77 y.o. female presenting with back pain. The history is provided by the patient.  Back Pain  This is a new problem. The current episode started more than 2 days ago. Episode frequency: intermittently. The problem has not changed since onset.The pain is associated with no known injury. The pain is present in the lumbar spine and sacro-iliac joint. The quality of the pain is described as shooting, burning and aching. The pain radiates to the right thigh, right knee and right foot. The pain is moderate. The symptoms are aggravated by certain positions (standing). The pain is worse during the day. Pertinent negatives include no chest pain, no fever, no numbness, no headaches, no abdominal pain, no abdominal swelling, no bowel incontinence, no perianal numbness, no bladder incontinence, no dysuria, no pelvic pain, no paresthesias, no tingling and no weakness. She has tried analgesics for the symptoms. The treatment provided moderate relief.    Past Medical History  Diagnosis Date  . H/O: Bell's palsy   . GERD (gastroesophageal reflux disease)   . Hyperlipemia   . Hypertension   . Chronic constipation   . S/P colonoscopy 12/11    Dr Georgiann Mohs fissure, mild colitis  . Diverticulosis 12/11    Past Surgical History  Procedure Date  . Ankle surgery     right   . Back surgery 1985  . Appendectomy   . Trigger finger release   . Tubal ligation   . Total hip arthroplasty 1990's    History reviewed. No pertinent family history.  History  Substance Use Topics  . Smoking status: Former Smoker -- 0.5 packs/day    Types: Cigarettes  . Smokeless tobacco:  Former Neurosurgeon    Quit date: 05/13/2009  . Alcohol Use: No    OB History    Grav Para Term Preterm Abortions TAB SAB Ect Mult Living                  Review of Systems  Constitutional: Negative for fever and appetite change.  HENT: Negative for neck pain and neck stiffness.   Eyes: Negative for pain.  Respiratory: Negative for chest tightness, shortness of breath and wheezing.   Cardiovascular: Negative for chest pain and leg swelling.  Gastrointestinal: Negative for nausea, vomiting, abdominal pain and bowel incontinence.  Genitourinary: Negative for bladder incontinence, dysuria, hematuria, flank pain, decreased urine volume and pelvic pain.  Musculoskeletal: Positive for back pain. Negative for joint swelling.  Skin: Negative.   Neurological: Negative for dizziness, tingling, weakness, numbness, headaches and paresthesias.  Hematological: Does not bruise/bleed easily.    Physical Exam  BP 135/80  Pulse 87  Temp(Src) 97.8 F (36.6 C) (Oral)  Resp 18  Ht 5\' 8"  (1.727 m)  Wt 198 lb (89.812 kg)  BMI 30.11 kg/m2  SpO2 98%  Physical Exam  Nursing note and vitals reviewed. Constitutional: She is oriented to person, place, and time. She appears well-nourished. No distress.  HENT:  Head: Normocephalic and atraumatic.  Neck: Normal range of motion. Neck supple.  Cardiovascular: Normal rate, regular rhythm and normal heart sounds.   Pulmonary/Chest: Effort normal. No respiratory distress.  She exhibits no tenderness.  Musculoskeletal: She exhibits tenderness. She exhibits no edema.       Lumbar back: She exhibits tenderness. She exhibits normal range of motion, no edema and normal pulse.       Back:  Lymphadenopathy:    She has no cervical adenopathy.  Neurological: She is alert and oriented to person, place, and time. She has normal reflexes. She exhibits normal muscle tone. Coordination normal.  Skin: Skin is warm and dry.  Psychiatric: She has a normal mood and affect.     ED Course  Procedures  MDM   1340  Patient is alert, NAD.  Vitals stable.  Ambulates w/o difficulty.  ttp of the right lumbar paraspinal muscles and right SI joint space.  No focal neuro deficits.  Patient is alert, smiling , non-toxic appearing.  I have reviewed the imaging results with the pt.      Alta Shober L. [place], [place] 11/24/10 2259

## 2010-11-23 NOTE — ED Provider Notes (Addendum)
Medical screening examination/treatment/procedure(s) were performed by non-physician practitioner and as supervising physician I was immediately available for consultation/collaboration.   Nelia Shi, MD 11/23/10 1633  Nelia Shi, MD 11/25/10 [phone]

## 2010-11-26 NOTE — ED Provider Notes (Signed)
Medical screening examination/treatment/procedure(s) were performed by non-physician practitioner and as supervising physician I was immediately available for consultation/collaboration.   Nelia Shi, MD 11/26/10 [phone]

## 2012-02-29 ENCOUNTER — Other Ambulatory Visit: Payer: Self-pay

## 2012-02-29 MED ORDER — LUBIPROSTONE 24 MCG PO CAPS: 24.0000 ug | ORAL_CAPSULE | Freq: Two times a day (BID) | ORAL | Status: DC

## 2012-02-29 NOTE — Telephone Encounter (Signed)
Pt will call back to set up appointment 

## 2012-02-29 NOTE — Telephone Encounter (Signed)
Let's have pt come in for yearly appt. I just gave her a refill on Amitiza. Thanks!

## 2012-05-17 ENCOUNTER — Encounter: Payer: Self-pay | Admitting: Gastroenterology

## 2012-05-18 ENCOUNTER — Ambulatory Visit: Payer: Medicare Other | Admitting: Gastroenterology

## 2012-06-06 ENCOUNTER — Ambulatory Visit (INDEPENDENT_AMBULATORY_CARE_PROVIDER_SITE_OTHER): Payer: Medicare Other | Admitting: Gastroenterology

## 2012-06-06 ENCOUNTER — Encounter: Payer: Self-pay | Admitting: Gastroenterology

## 2012-06-06 VITALS — BP 148/88 | HR 95 | Temp 97.6°F | Ht 66.0 in | Wt 202.4 lb

## 2012-06-06 DIAGNOSIS — K5900 Constipation, unspecified: Secondary | ICD-10-CM

## 2012-06-06 DIAGNOSIS — K219 Gastro-esophageal reflux disease without esophagitis: Secondary | ICD-10-CM

## 2012-06-06 MED ORDER — ESOMEPRAZOLE MAGNESIUM 40 MG PO PACK: 40.0000 mg | PACK | Freq: Every day | ORAL | Status: DC

## 2012-06-06 MED ORDER — LUBIPROSTONE 24 MCG PO CAPS: 24.0000 ug | ORAL_CAPSULE | Freq: Two times a day (BID) | ORAL | Status: DC

## 2012-06-06 NOTE — Progress Notes (Signed)
Referring Provider: Claggett, Autumn Patty, PA Primary Care Physician:  Rush Barer, [place] Primary Gastroenterologist: Dr. Darrick Penna   Chief Complaint  Patient presents with  . Medication Refill    HPI:   Ms. Victoria Klein presents today with hx of chronic constipation and need for further refills. Last seen July 2012 in our office. 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 No issues with constipation. Occasionally rare paper hematochezia. No abdominal pain. No N/V. States occasionally a little queasy taking Amitiza but doesn't want to change it. +nocturnal reflux. Has taken Nexium in the past. Not currently taking a PPI.   Past Medical History  Diagnosis Date  . H/O: Bell's palsy   . GERD (gastroesophageal reflux disease)   . Hyperlipemia   . Hypertension   . Chronic constipation   . S/P colonoscopy 12/11    Dr Georgiann Mohs fissure, mild colitis  . Diverticulosis 12/11    Past Surgical History  Procedure Laterality Date  . Ankle surgery      right   . Back surgery  1985  . Appendectomy    . Trigger finger release    . Tubal ligation    . Total hip arthroplasty  1990's  . Colonoscopy  04/04/2010    ZOX:[id number] colon/rare diverticula/small internal hemorrhoids/anal fissure    Current Outpatient Prescriptions  Medication Sig Dispense Refill  . amLODipine (NORVASC) 5 MG tablet Take 5 mg by mouth every morning.        Marland Kitchen aspirin 325 MG tablet Take 325 mg by mouth daily.        . hydrochlorothiazide (,MICROZIDE/HYDRODIURIL,) 12.5 MG capsule Take 12.5 mg by mouth every morning.       . lubiprostone (AMITIZA) 24 MCG capsule Take 1 capsule (24 mcg total) by mouth 2 (two) times daily with a meal.  60 capsule  11  . amLODipine (NORVASC) 10 MG tablet       . esomeprazole (NEXIUM) 40 MG packet Take 40 mg by mouth daily before breakfast.  30 each  12   No current facility-administered medications for this visit.    Allergies as of 06/06/2012  . (No Known Allergies)    No family history on file.  History    Social History  . Marital Status: Widowed    Spouse Name: N/A    Number of Children: N/A  . Years of Education: N/A   Social History Main Topics  . Smoking status: Former Smoker -- 0.50 packs/day    Types: Cigarettes  . Smokeless tobacco: Former Neurosurgeon    Quit date: 05/13/2009  . Alcohol Use: No  . Drug Use: No  . Sexually Active: None   Other Topics Concern  . None   Social History Narrative  . None    Review of Systems: Negative unless mentioned in HPI.   Physical Exam: BP 148/88  Pulse 95  Temp(Src) 97.6 F (36.4 C) (Oral)  Ht 5\' 6"  (1.676 m)  Wt 202 lb 6.4 oz (91.808 kg)  BMI 32.68 kg/m2 General:   Alert and oriented. No distress noted. Pleasant and cooperative.  Head:  Normocephalic and atraumatic. Eyes:  Conjuctiva clear without scleral icterus. Mouth:  Oral mucosa pink and moist. Good dentition. No lesions. Heart:  S1, S2 present without murmurs, rubs, or gallops. Regular rate and rhythm. Abdomen:  +BS, soft, non-tender and non-distended. No rebound or guarding. No HSM or masses noted. Msk:  Symmetrical without gross deformities. Normal posture. Extremities:  Without edema. Neurologic:  Alert and  oriented x4;  grossly normal neurologically. Skin:  Intact without significant lesions or rashes. Cervical Nodes:  No significant cervical adenopathy. Psych:  Alert and cooperative. Normal mood and affect.

## 2012-06-06 NOTE — Patient Instructions (Addendum)
Continue to take Amitiza twice a day with meals.   I have restarted your reflux medicine called Nexium. Samples have been provided, and I sent the prescription to your pharmacy.   Review the reflux diet.  We will see you back in 3 months.    Diet for Gastroesophageal Reflux Disease, Adult Reflux (acid reflux) is when acid from your stomach flows up into the esophagus. When acid comes in contact with the esophagus, the acid causes irritation and soreness (inflammation) in the esophagus. When reflux happens often or so severely that it causes damage to the esophagus, it is called gastroesophageal reflux disease (GERD). Nutrition therapy can help ease the discomfort of GERD. FOODS OR DRINKS TO AVOID OR LIMIT  Smoking or chewing tobacco. Nicotine is one of the most potent stimulants to acid production in the gastrointestinal tract.  Caffeinated and decaffeinated coffee and black tea.  Regular or low-calorie carbonated beverages or energy drinks (caffeine-free carbonated beverages are allowed).   Strong spices, such as black pepper, white pepper, red pepper, cayenne, curry powder, and chili powder.  Peppermint or spearmint.  Chocolate.  High-fat foods, including meats and fried foods. Extra added fats including oils, butter, salad dressings, and nuts. Limit these to less than 8 tsp per day.  Fruits and vegetables if they are not tolerated, such as citrus fruits or tomatoes.  Alcohol.  Any food that seems to aggravate your condition. If you have questions regarding your diet, call your caregiver or a registered dietitian. OTHER THINGS THAT MAY HELP GERD INCLUDE:   Eating your meals slowly, in a relaxed setting.  Eating 5 to 6 small meals per day instead of 3 large meals.  Eliminating food for a period of time if it causes distress.  Not lying down until 3 hours after eating a meal.  Keeping the head of your bed raised 6 to 9 inches (15 to 23 cm) by using a foam wedge or blocks  under the legs of the bed. Lying flat may make symptoms worse.  Being physically active. Weight loss may be helpful in reducing reflux in overweight or obese adults.  Wear loose fitting clothing EXAMPLE MEAL PLAN This meal plan is approximately 2,000 calories based on https://www.bernard.org/ meal planning guidelines. Breakfast   cup cooked oatmeal.  1 cup strawberries.  1 cup low-fat milk.  1 oz almonds. Snack  1 cup cucumber slices.  6 oz yogurt (made from low-fat or fat-free milk). Lunch  2 slice whole-wheat bread.  2 oz sliced [place].  2 tsp mayonnaise.  1 cup blueberries.  1 cup snap peas. Snack  6 whole-wheat crackers.  1 oz string cheese. Dinner   cup brown rice.  1 cup mixed veggies.  1 tsp olive oil.  3 oz grilled fish. Document Released: 04/13/2005 Document Revised: 07/06/2011 Document Reviewed: 02/27/2011 [facility] Patient Information 2013 [place], [place].

## 2012-06-07 DIAGNOSIS — K219 Gastro-esophageal reflux disease without esophagitis: Secondary | ICD-10-CM | POA: Insufficient documentation

## 2012-06-07 NOTE — Assessment & Plan Note (Signed)
Has taken Nexium in the remote past. Restart. Samples and prescription provided. Return in 3 months for reassessment.

## 2012-06-07 NOTE — Assessment & Plan Note (Signed)
79 year old female with chronic constipation that has responded well to Amitiza 24 mcg BID. She does note occasional slight nausea with this, despite taking with food. I discussed switching to a different agent if this is bothersome; however, she is content with staying on this current regimen. Likely nausea secondary to known possible side effect of Amitiza.  Continue Amitiza BID. Refills provided.

## 2012-06-07 NOTE — Progress Notes (Signed)
Faxed to PCP

## 2012-09-14 ENCOUNTER — Ambulatory Visit: Payer: Medicare Other | Admitting: Gastroenterology

## 2012-09-27 NOTE — Progress Notes (Signed)
REVIEWED.  

## 2012-10-26 ENCOUNTER — Ambulatory Visit: Payer: Medicare Other | Admitting: Gastroenterology

## 2012-11-09 ENCOUNTER — Ambulatory Visit: Payer: Medicare Other | Admitting: Gastroenterology

## 2012-12-14 ENCOUNTER — Encounter: Payer: Self-pay | Admitting: Gastroenterology

## 2012-12-14 ENCOUNTER — Ambulatory Visit (INDEPENDENT_AMBULATORY_CARE_PROVIDER_SITE_OTHER): Payer: Medicare Other | Admitting: Gastroenterology

## 2012-12-14 VITALS — BP 127/82 | HR 108 | Temp 98.4°F | Ht 66.0 in | Wt 196.8 lb

## 2012-12-14 DIAGNOSIS — K219 Gastro-esophageal reflux disease without esophagitis: Secondary | ICD-10-CM

## 2012-12-14 DIAGNOSIS — K625 Hemorrhage of anus and rectum: Secondary | ICD-10-CM

## 2012-12-14 DIAGNOSIS — K5900 Constipation, unspecified: Secondary | ICD-10-CM

## 2012-12-14 MED ORDER — ESOMEPRAZOLE MAGNESIUM 40 MG PO PACK: 40.0000 mg | PACK | Freq: Every day | ORAL | Status: DC

## 2012-12-14 MED ORDER — LUBIPROSTONE 24 MCG PO CAPS: 24.0000 ug | ORAL_CAPSULE | Freq: Two times a day (BID) | ORAL | Status: DC

## 2012-12-14 NOTE — Patient Instructions (Addendum)
DRINK WATER TO KEEP YOUR URINE LIGHT YELLOW. DRINK 6 TO 8 CUPS A DAY. ADD LEMON, LIME, OR ORANGE SLICES TO YOUR WATER.  MAKE YOUR OWN GREEN TEA AND SWEETEN IT WITH STEVIA OR SPLENDA.  LOSE 10 LBS.  FOLLOW A HIGH FIBER/LOW FAT DIET. SEE INFO BELOW.  CONTINUE AMITIZA & NEXIUM. I REFILLED BOTH FOR ONE YEAR.  FOLLOW UP IN 1 YEAR.   High-Fiber Diet A high-fiber diet changes your normal diet to include more whole grains, legumes, fruits, and vegetables. Changes in the diet involve replacing refined carbohydrates with unrefined foods. The calorie level of the diet is essentially unchanged. The Dietary Reference Intake (recommended amount) for adult males is 38 grams per day. For adult females, it is 25 grams per day. Pregnant and lactating women should consume 28 grams of fiber per day. Fiber is the intact part of a plant that is not broken down during digestion. Functional fiber is fiber that has been isolated from the plant to provide a beneficial effect in the body. PURPOSE  Increase stool bulk.   Ease and regulate bowel movements.   Lower cholesterol.  INDICATIONS THAT YOU NEED MORE FIBER  Constipation and hemorrhoids.   Uncomplicated diverticulosis (intestine condition) and irritable bowel syndrome.   Weight management.   As a protective measure against hardening of the arteries (atherosclerosis), diabetes, and cancer.   GUIDELINES FOR INCREASING FIBER IN THE DIET  Start adding fiber to the diet slowly. A gradual increase of about 5 more grams (2 slices of whole-wheat bread, 2 servings of most fruits or vegetables, or 1 bowl of high-fiber cereal) per day is best. Too rapid an increase in fiber may result in constipation, flatulence, and bloating.   Drink enough water and fluids to keep your urine clear or pale yellow. Water, juice, or caffeine-free drinks are recommended. Not drinking enough fluid may cause constipation.   Eat a variety of high-fiber foods rather than one type of  fiber.   Try to increase your intake of fiber through using high-fiber foods rather than fiber pills or supplements that contain small amounts of fiber.   The goal is to change the types of food eaten. Do not supplement your present diet with high-fiber foods, but replace foods in your present diet.  INCLUDE A VARIETY OF FIBER SOURCES  Replace refined and processed grains with whole grains, canned fruits with fresh fruits, and incorporate other fiber sources. White rice, white breads, and most bakery goods contain little or no fiber.   Brown whole-grain rice, buckwheat oats, and many fruits and vegetables are all good sources of fiber. These include: broccoli, Brussels sprouts, cabbage, cauliflower, beets, sweet potatoes, white potatoes (skin on), carrots, tomatoes, eggplant, squash, berries, fresh fruits, and dried fruits.   Cereals appear to be the richest source of fiber. Cereal fiber is found in whole grains and bran. Bran is the fiber-rich outer coat of cereal grain, which is largely removed in refining. In whole-grain cereals, the bran remains. In breakfast cereals, the largest amount of fiber is found in those with "bran" in their names. The fiber content is sometimes indicated on the label.   You may need to include additional fruits and vegetables each day.   In baking, for 1 cup white flour, you may use the following substitutions:   1 cup whole-wheat flour minus 2 tablespoons.   1/2 cup white flour plus 1/2 cup whole-wheat flour.   Low-Fat Diet BREADS, CEREALS, PASTA, RICE, DRIED PEAS, AND BEANS These products  are high in carbohydrates and most are low in fat. Therefore, they can be increased in the diet as substitutes for fatty foods. They too, however, contain calories and should not be eaten in excess. Cereals can be eaten for snacks as well as for breakfast.   FRUITS AND VEGETABLES It is good to eat fruits and vegetables. Besides being sources of fiber, both are rich in  vitamins and some minerals. They help you get the daily allowances of these nutrients. Fruits and vegetables can be used for snacks and desserts.  MEATS Limit lean meat, chicken, [place], and fish to no more than 6 ounces per day. Beef, Pork, and Lamb Use lean cuts of beef, pork, and lamb. Lean cuts include:  Extra-lean ground beef.  Arm roast.  Sirloin tip.  Center-cut ham.  Round steak.  Loin chops.  Rump roast.  Tenderloin.  Trim all fat off the outside of meats before cooking. It is not necessary to severely decrease the intake of red meat, but lean choices should be made. Lean meat is rich in protein and contains a highly absorbable form of iron. Premenopausal women, in particular, should avoid reducing lean red meat because this could increase the risk for low red blood cells (iron-deficiency anemia).  Chicken and [place] These are good sources of protein. The fat of poultry can be reduced by removing the skin and underlying fat layers before cooking. Chicken and [place] can be substituted for lean red meat in the diet. Poultry should not be fried or covered with high-fat sauces. Fish and Shellfish Fish is a good source of protein. Shellfish contain cholesterol, but they usually are low in saturated fatty acids. The preparation of fish is important. Like chicken and [place], they should not be fried or covered with high-fat sauces. EGGS Egg whites contain no fat or cholesterol. They can be eaten often. Try 1 to 2 egg whites instead of whole eggs in recipes or use egg substitutes that do not contain yolk. MILK AND DAIRY PRODUCTS Use skim or 1% milk instead of 2% or whole milk. Decrease whole milk, natural, and processed cheeses. Use nonfat or low-fat (2%) cottage cheese or low-fat cheeses made from vegetable oils. Choose nonfat or low-fat (1 to 2%) yogurt. Experiment with evaporated skim milk in recipes that call for heavy cream. Substitute low-fat yogurt or low-fat cottage cheese for sour  cream in dips and salad dressings. Have at least 2 servings of low-fat dairy products, such as 2 glasses of skim (or 1%) milk each day to help get your daily calcium intake. FATS AND OILS Reduce the total intake of fats, especially saturated fat. Butterfat, lard, and beef fats are high in saturated fat and cholesterol. These should be avoided as much as possible. Vegetable fats do not contain cholesterol, but certain vegetable fats, such as coconut oil, palm oil, and palm kernel oil are very high in saturated fats. These should be limited. These fats are often used in bakery goods, processed foods, popcorn, oils, and nondairy creamers. Vegetable shortenings and some peanut butters contain hydrogenated oils, which are also saturated fats. Read the labels on these foods and check for saturated vegetable oils. Unsaturated vegetable oils and fats do not raise blood cholesterol. However, they should be limited because they are fats and are high in calories. Total fat should still be limited to 30% of your daily caloric intake. Desirable liquid vegetable oils are corn oil, cottonseed oil, olive oil, canola oil, safflower oil, soybean oil, and sunflower oil.  Peanut oil is not as good, but small amounts are acceptable. Buy a heart-healthy tub margarine that has no partially hydrogenated oils in the ingredients. Mayonnaise and salad dressings often are made from unsaturated fats, but they should also be limited because of their high calorie and fat content. Seeds, nuts, peanut butter, olives, and avocados are high in fat, but the fat is mainly the unsaturated type. These foods should be limited mainly to avoid excess calories and fat. OTHER EATING TIPS Snacks  Most sweets should be limited as snacks. They tend to be rich in calories and fats, and their caloric content outweighs their nutritional value. Some good choices in snacks are graham crackers, melba toast, soda crackers, bagels (no egg), English muffins,  fruits, and vegetables. These snacks are preferable to snack crackers, [place] fries, TORTILLA CHIPS, and POTATO chips. Popcorn should be air-popped or cooked in small amounts of liquid vegetable oil. Desserts Eat fruit, low-fat yogurt, and fruit ices instead of pastries, cake, and cookies. Sherbet, angel food cake, gelatin dessert, frozen low-fat yogurt, or other frozen products that do not contain saturated fat (pure fruit juice bars, frozen ice pops) are also acceptable.  COOKING METHODS Choose those methods that use little or no fat. They include: Poaching.  Braising.  Steaming.  Grilling.  Baking.  Stir-frying.  Broiling.  Microwaving.  Foods can be cooked in a nonstick pan without added fat, or use a nonfat cooking spray in regular cookware. Limit fried foods and avoid frying in saturated fat. Add moisture to lean meats by using water, broth, cooking wines, and other nonfat or low-fat sauces along with the cooking methods mentioned above. Soups and stews should be chilled after cooking. The fat that forms on top after a few hours in the refrigerator should be skimmed off. When preparing meals, avoid using excess salt. Salt can contribute to raising blood pressure in some people.  EATING AWAY FROM HOME Order entres, potatoes, and vegetables without sauces or butter. When meat exceeds the size of a deck of cards (3 to 4 ounces), the rest can be taken home for another meal. Choose vegetable or fruit salads and ask for low-calorie salad dressings to be served on the side. Use dressings sparingly. Limit high-fat toppings, such as bacon, crumbled eggs, cheese, sunflower seeds, and olives. Ask for heart-healthy tub margarine instead of butter.

## 2012-12-14 NOTE — Assessment & Plan Note (Signed)
FAIRLY WELL CONTROLLED.  DRINK WATER TO KEEP YOUR URINE LIGHT YELLOW. DRINK 6 TO 8 CUPS A DAY. ADD LEMON, LIME, OR ORANGE SLICES TO YOUR WATER. MAKE YOUR OWN GREEN TEA AND SWEETEN IT WITH STEVIA OR SPLENDA. LOSE 10 LBS. FOLLOW A HIGH FIBER/LOW FAT DIET.  HO GIVEN. CONTINUE AMITIZA. REFILLED FOR ONE YEAR. FOLLOW UP IN 1 YEAR.

## 2012-12-14 NOTE — Progress Notes (Signed)
  Subjective:    Patient ID: Victoria Klein, female    DOB: [birth year], 79 y.o.   MRN: [id number]  CLAGGETT,ELIN, PA-C  HPI Wants to lose weight. For breakfast: yogurt/sausage biscuit. Lunch: orange. Cooking cream potatoes/fried chicken. TOLD HER A FEW MOS AGO SHE GOT SUGAR. WENT ON VACATION AND GAINED 10 LBS. DOESN'T DRINK SODAS. DRINKS GREEN TEA. BMs: BETTER, 1X/DAY. TAKES AMITIZA EVERY DAY. MAY HAVE RECTAL BLEEDING(RARE). MAY HAVE BREAKTHROUGH REFLUX. THINK I LOOK LIKE A LITTLE GIRL.  PT DENIES FEVER, CHILLS, nausea, vomiting, melena, diarrhea, constipation, abd pain, problems swallowing, OR heartburn or indigestion.  Past Medical History  Diagnosis Date  . H/O: Bell's palsy   . GERD (gastroesophageal reflux disease)   . Hyperlipemia   . Hypertension   . Chronic constipation   . S/P colonoscopy 12/11    Dr Georgiann Mohs fissure, mild colitis  . Diverticulosis 12/11   Past Surgical History  Procedure Laterality Date  . Ankle surgery      right   . Back surgery  1985  . Appendectomy    . Trigger finger release    . Tubal ligation    . Total hip arthroplasty  1990's  . Colonoscopy  04/04/2010    [facility]:NFAOZHY colon/rare diverticula/small internal hemorrhoids/anal fissure   No Known Allergies  Current Outpatient Prescriptions  Medication Sig Dispense Refill  . amLODipine (NORVASC) 5 MG tablet Take 5 mg by mouth every morning.        Marland Kitchen aspirin 325 MG tablet Take 325 mg by mouth daily.        Marland Kitchen esomeprazole (NEXIUM) 40 MG packet Take 40 mg by mouth daily before breakfast.    . fenofibrate (TRICOR) 145 MG tablet Take 145 mg by mouth daily.     Marland Kitchen glipiZIDE (GLUCOTROL) 5 MG tablet     . hydrochlorothiazide (,MICROZIDE/HYDRODIURIL,) 12.5 MG capsule Take 12.5 mg by mouth every morning.     . lubiprostone (AMITIZA) 24 MCG capsule Take 1 capsule (24 mcg total) by mouth 2 (two) times daily with a meal.    . TRAVATAN Z 0.004 % SOLN ophthalmic solution Place 1 drop into both eyes at  bedtime.           Review of Systems     Objective:   Physical Exam  Vitals reviewed. Constitutional: She is oriented to person, place, and time. She appears well-nourished. No distress.  HENT:  Head: Normocephalic and atraumatic.  Mouth/Throat: No oropharyngeal exudate.  Eyes: Pupils are equal, round, and reactive to light. No scleral icterus.  Neck: Normal range of motion. Neck supple.  Cardiovascular: Normal rate, regular rhythm and normal heart sounds.   Pulmonary/Chest: Effort normal and breath sounds normal. No respiratory distress.  Abdominal: Soft. Bowel sounds are normal. She exhibits no distension. There is no tenderness.  Musculoskeletal: She exhibits no edema.  Lymphadenopathy:    She has no cervical adenopathy.  Neurological: She is alert and oriented to person, place, and time.  NO FOCAL DEFICITS   Psychiatric: She has a normal mood and affect.          Assessment & Plan:

## 2012-12-14 NOTE — Progress Notes (Signed)
CC'd to PCP 

## 2012-12-14 NOTE — Progress Notes (Signed)
Reminder appt made 

## 2012-12-14 NOTE — Assessment & Plan Note (Signed)
RARE  MONITOR FOR RECURRENT SYMPTOMS. CONSIDER [facility] BANDING.

## 2012-12-14 NOTE — Assessment & Plan Note (Signed)
SX CONTROLLED. CONTINUE NEXIUM, REFILL X 1 YEAR.

## 2013-07-11 ENCOUNTER — Emergency Department (HOSPITAL_COMMUNITY)
Admission: EM | Admit: 2013-07-11 | Discharge: 2013-07-11 | Disposition: A | Discharge status: Home / Self Care | Payer: Medicare Other | Attending: Emergency Medicine | Admitting: Emergency Medicine

## 2013-07-11 ENCOUNTER — Encounter (HOSPITAL_COMMUNITY): Payer: Self-pay | Admitting: Emergency Medicine

## 2013-07-11 DIAGNOSIS — M549 Dorsalgia, unspecified: Secondary | ICD-10-CM | POA: Insufficient documentation

## 2013-07-11 DIAGNOSIS — Z87891 Personal history of nicotine dependence: Secondary | ICD-10-CM | POA: Insufficient documentation

## 2013-07-11 DIAGNOSIS — E785 Hyperlipidemia, unspecified: Secondary | ICD-10-CM | POA: Insufficient documentation

## 2013-07-11 DIAGNOSIS — IMO0002 Reserved for concepts with insufficient information to code with codable children: Secondary | ICD-10-CM | POA: Insufficient documentation

## 2013-07-11 DIAGNOSIS — R1110 Vomiting, unspecified: Secondary | ICD-10-CM | POA: Insufficient documentation

## 2013-07-11 DIAGNOSIS — Z79899 Other long term (current) drug therapy: Secondary | ICD-10-CM | POA: Insufficient documentation

## 2013-07-11 DIAGNOSIS — K219 Gastro-esophageal reflux disease without esophagitis: Secondary | ICD-10-CM | POA: Insufficient documentation

## 2013-07-11 DIAGNOSIS — IMO0001 Reserved for inherently not codable concepts without codable children: Secondary | ICD-10-CM | POA: Insufficient documentation

## 2013-07-11 DIAGNOSIS — Z9889 Other specified postprocedural states: Secondary | ICD-10-CM | POA: Insufficient documentation

## 2013-07-11 DIAGNOSIS — I10 Essential (primary) hypertension: Secondary | ICD-10-CM | POA: Insufficient documentation

## 2013-07-11 DIAGNOSIS — Z7982 Long term (current) use of aspirin: Secondary | ICD-10-CM | POA: Insufficient documentation

## 2013-07-11 DIAGNOSIS — K5900 Constipation, unspecified: Secondary | ICD-10-CM | POA: Insufficient documentation

## 2013-07-11 DIAGNOSIS — M7918 Myalgia, other site: Secondary | ICD-10-CM

## 2013-07-11 DIAGNOSIS — Z8669 Personal history of other diseases of the nervous system and sense organs: Secondary | ICD-10-CM | POA: Insufficient documentation

## 2013-07-11 LAB — CBG MONITORING, ED: Glucose-Capillary: 141 mg/dL — ABNORMAL HIGH (ref 70–99)

## 2013-07-11 MED ORDER — PREDNISONE 10 MG PO TABS: 20.0000 mg | ORAL_TABLET | Freq: Two times a day (BID) | ORAL | Status: DC

## 2013-07-11 NOTE — ED Provider Notes (Signed)
CSN: [id number]     Arrival date & time 07/11/13  1245 History  This chart was scribed for Flint MelterElliott L Shanty Ginty, MD by Leone PayorSonum Patel, ED Scribe. This patient was seen in room APA06/APA06 and the patient's care was started 1:45 PM.      Chief Complaint  Patient presents with  . Neck Pain  . Back Pain      The history is provided by the patient. No language interpreter was used.    HPI Comments: Victoria Greenstella Xie is a 80 y.o. female who presents to the Emergency Department complaining of 2 weeks of constant, unchanged left neck pain and right back pain. She denies any recent injury or trauma. She reports having 1 episode of vomiting 4 days. She reports being able to ambulate but states it has become difficult due to the pain. She states the pain causes her to have trouble sleeping. She denies sneezing, cough, diarrhea, fever, constipation, dysuria. She has a history of back surgery.   [place]   Past Medical History  Diagnosis Date  . H/O: Bell's palsy   . GERD (gastroesophageal reflux disease)   . Hyperlipemia   . Hypertension   . Chronic constipation   . S/P colonoscopy 12/11    Dr Georgiann MohsFields->anal fissure, mild colitis  . Diverticulosis 12/11   Past Surgical History  Procedure Laterality Date  . Ankle surgery      right   . Back surgery  1985  . Appendectomy    . Trigger finger release    . Tubal ligation    . Total hip arthroplasty  1990's  . Colonoscopy  04/04/2010    [facility]:IONGEXBSLF:tortuos colon/rare diverticula/small internal hemorrhoids/anal fissure   No family history on file. History  Substance Use Topics  . Smoking status: Former Smoker -- 0.50 packs/day    Types: Cigarettes  . Smokeless tobacco: Former NeurosurgeonUser    Quit date: 05/13/2009  . Alcohol Use: No   OB History   Grav Para Term Preterm Abortions TAB SAB Ect Mult Living                 Review of Systems  Constitutional: Negative for fever.  HENT: Negative for sneezing.   Respiratory: Negative for cough.    Gastrointestinal: Negative for diarrhea and constipation.  Genitourinary: Negative for dysuria.  Musculoskeletal: Positive for back pain and neck pain.  Neurological: Negative for numbness.  All other systems reviewed and are negative.      Allergies  Review of patient's allergies indicates no known allergies.  Home Medications   Current Outpatient Rx  Name  Route  Sig  Dispense  Refill  . amLODipine (NORVASC) 5 MG tablet   Oral   Take 5 mg by mouth every morning.           Marland Kitchen. aspirin 325 MG tablet   Oral   Take 325 mg by mouth daily.           Marland Kitchen. esomeprazole (NEXIUM) 40 MG packet   Oral   Take 40 mg by mouth daily before breakfast.   30 each   12   . fenofibrate (TRICOR) 145 MG tablet   Oral   Take 145 mg by mouth daily.          Marland Kitchen. glipiZIDE (GLUCOTROL) 5 MG tablet               . hydrochlorothiazide (,MICROZIDE/HYDRODIURIL,) 12.5 MG capsule   Oral   Take 12.5 mg by mouth every morning.          .Marland Kitchen  lubiprostone (AMITIZA) 24 MCG capsule   Oral   Take 1 capsule (24 mcg total) by mouth 2 (two) times daily with a meal.   60 capsule   11   . predniSONE (DELTASONE) 10 MG tablet   Oral   Take 2 tablets (20 mg total) by mouth 2 (two) times daily.   10 tablet   0   . TRAVATAN Z 0.004 % SOLN ophthalmic solution   Both Eyes   Place 1 drop into both eyes at bedtime.           BP 158/83  Pulse 107  Temp(Src) 97.9 F (36.6 C) (Oral)  Resp 20  Ht 5\' 7"  (1.702 m)  Wt 190 lb (86.183 kg)  BMI 29.75 kg/m2  SpO2 100% Physical Exam  Nursing note and vitals reviewed. Constitutional: She is oriented to person, place, and time. She appears well-developed and well-nourished.  HENT:  Head: Normocephalic and atraumatic.  Eyes: Conjunctivae and EOM are normal. Pupils are equal, round, and reactive to light.  Neck: Normal range of motion and phonation normal. Neck supple.  Midline cervical spine tenderness. Left lateral paravertebral muscle tenderness along  the cervical spine. Pain with left lateral bending and flexion.   Cardiovascular: Normal rate, regular rhythm and intact distal pulses.   Pulmonary/Chest: Effort normal and breath sounds normal. She exhibits no tenderness.  Abdominal: Soft. She exhibits no distension. There is no tenderness. There is no guarding.  Musculoskeletal: Normal range of motion. She exhibits tenderness.  No tenderness of the thoracic or lumbar spine. Right lumbar tenderness to palpation. Right antalgic gait  Neurological: She is alert and oriented to person, place, and time. She exhibits normal muscle tone.  Skin: Skin is warm and dry.  Psychiatric: She has a normal mood and affect. Her behavior is normal. Judgment and thought content normal.    ED Course  Procedures (including critical care time) DIAGNOSTIC STUDIES: Oxygen Saturation is 100% on RA, normal by my interpretation.    COORDINATION OF CARE: 1:48 PM Discussed treatment plan with pt at bedside and pt agreed to plan. All questions answered.   Labs Review Labs Reviewed  CBG MONITORING, ED - Abnormal; Notable for the following:    Glucose-Capillary 141 (*)    All other components within normal limits    MDM   Final diagnoses:  Musculoskeletal pain    Signs and symptoms of musculoskeletal pain, left neck and right lower back. Doubt fracture, significant neuropathy, cauda equina, fracture or central process causing her discomfort.  Nursing Notes Reviewed/ Care Coordinated Applicable Imaging Reviewed Interpretation of Laboratory Data incorporated into ED treatment  The patient appears reasonably screened and/or stabilized for discharge and I doubt any other medical condition or other [facility] requiring further screening, evaluation, or treatment in the ED at this time prior to discharge.  Plan: Home Medications- Prednisone, Tylenol; Home Treatments- rest, heat; return here if the recommended treatment, does not improve the symptoms; Recommended follow  up- PCP 1 week for check up   I personally performed the services described in this documentation, which was scribed in my presence. The recorded information has been reviewed and is accurate.     Flint Melter, MD 07/11/13 [phone]

## 2013-07-11 NOTE — ED Notes (Signed)
Pt reports pain in r lower back and left side of neck x 2 weeks.  Denies injury.  Reports pain has been getting progressively worse over the past few days.  Pt says hurts to move, hurts to turn head to the right.  Reports can't sleep at night due to pain.

## 2013-07-11 NOTE — Discharge Instructions (Signed)
Use heat on the sore area 3 or 4 times a day. Take Tylenol, every 4 hours, for pain.    Musculoskeletal Pain Musculoskeletal pain is muscle and boney aches and pains. These pains can occur in any part of the body. Your caregiver may treat you without knowing the cause of the pain. They may treat you if blood or urine tests, X-rays, and other tests were normal.  CAUSES There is often not a definite cause or reason for these pains. These pains may be caused by a type of germ (virus). The discomfort may also come from overuse. Overuse includes working out too hard when your body is not fit. Boney aches also come from weather changes. Bone is sensitive to atmospheric pressure changes. HOME CARE INSTRUCTIONS   Ask when your test results will be ready. Make sure you get your test results.  Only take over-the-counter or prescription medicines for pain, discomfort, or fever as directed by your caregiver. If you were given medications for your condition, do not drive, operate machinery or power tools, or sign legal documents for 24 hours. Do not drink alcohol. Do not take sleeping pills or other medications that may interfere with treatment.  Continue all activities unless the activities cause more pain. When the pain lessens, slowly resume normal activities. Gradually increase the intensity and duration of the activities or exercise.  During periods of severe pain, bed rest may be helpful. Lay or sit in any position that is comfortable.  Putting ice on the injured area.  Put ice in a bag.  Place a towel between your skin and the bag.  Leave the ice on for 15 to 20 minutes, 3 to 4 times a day.  Follow up with your caregiver for continued problems and no reason can be found for the pain. If the pain becomes worse or does not go away, it may be necessary to repeat tests or do additional testing. Your caregiver may need to look further for a possible cause. SEEK IMMEDIATE MEDICAL CARE IF:  You have  pain that is getting worse and is not relieved by medications.  You develop chest pain that is associated with shortness or breath, sweating, feeling sick to your stomach (nauseous), or throw up (vomit).  Your pain becomes localized to the abdomen.  You develop any new symptoms that seem different or that concern you. MAKE SURE YOU:   Understand these instructions.  Will watch your condition.  Will get help right away if you are not doing well or get worse. Document Released: 04/13/2005 Document Revised: 07/06/2011 Document Reviewed: 12/16/2012 [facility] Patient Information 2014 [place], [place].

## 2013-12-04 ENCOUNTER — Encounter: Payer: Self-pay | Admitting: Gastroenterology

## 2014-01-10 ENCOUNTER — Encounter: Payer: Self-pay | Admitting: Gastroenterology

## 2014-01-10 ENCOUNTER — Encounter: Payer: Medicare Other | Admitting: Gastroenterology

## 2014-01-10 NOTE — Progress Notes (Signed)
   Subjective:    Patient ID: Victoria Klein, female    DOB: [birth year], 80 y.o.   MRN: [id number]  HPI   Past Medical History  Diagnosis Date  . H/O: Bell's palsy   . GERD (gastroesophageal reflux disease)   . Hyperlipemia   . Hypertension   . Chronic constipation   . S/P colonoscopy 12/11    Dr Georgiann Mohs fissure, mild colitis  . Diverticulosis 12/11     Review of Systems     Objective:   Physical Exam        Assessment & Plan:

## 2014-10-01 ENCOUNTER — Other Ambulatory Visit: Payer: Self-pay

## 2014-10-02 MED ORDER — ESOMEPRAZOLE MAGNESIUM 40 MG PO PACK: 40.0000 mg | PACK | Freq: Every day | ORAL | Status: DC

## 2014-10-02 NOTE — Telephone Encounter (Signed)
I will send in a few months worth, but she is approaching 2 years with no visit. Please schedule her for a follow-up by August. Thanks

## 2019-10-27 ENCOUNTER — Emergency Department (HOSPITAL_COMMUNITY)
Admission: EM | Admit: 2019-10-27 | Discharge: 2019-10-27 | Disposition: A | Discharge status: Home / Self Care | Payer: Medicare Other | Attending: Emergency Medicine | Admitting: Emergency Medicine

## 2019-10-27 ENCOUNTER — Encounter (HOSPITAL_COMMUNITY): Payer: Self-pay | Admitting: Emergency Medicine

## 2019-10-27 ENCOUNTER — Other Ambulatory Visit: Payer: Self-pay

## 2019-10-27 ENCOUNTER — Emergency Department (HOSPITAL_COMMUNITY): Payer: Medicare Other

## 2019-10-27 DIAGNOSIS — Z7982 Long term (current) use of aspirin: Secondary | ICD-10-CM | POA: Diagnosis not present

## 2019-10-27 DIAGNOSIS — K219 Gastro-esophageal reflux disease without esophagitis: Secondary | ICD-10-CM | POA: Diagnosis not present

## 2019-10-27 DIAGNOSIS — R1030 Lower abdominal pain, unspecified: Secondary | ICD-10-CM | POA: Diagnosis not present

## 2019-10-27 DIAGNOSIS — K5909 Other constipation: Secondary | ICD-10-CM | POA: Diagnosis not present

## 2019-10-27 DIAGNOSIS — Z79899 Other long term (current) drug therapy: Secondary | ICD-10-CM | POA: Diagnosis not present

## 2019-10-27 DIAGNOSIS — F17210 Nicotine dependence, cigarettes, uncomplicated: Secondary | ICD-10-CM | POA: Insufficient documentation

## 2019-10-27 DIAGNOSIS — R10814 Left lower quadrant abdominal tenderness: Secondary | ICD-10-CM | POA: Diagnosis not present

## 2019-10-27 DIAGNOSIS — R197 Diarrhea, unspecified: Secondary | ICD-10-CM | POA: Diagnosis present

## 2019-10-27 DIAGNOSIS — Z96649 Presence of unspecified artificial hip joint: Secondary | ICD-10-CM | POA: Insufficient documentation

## 2019-10-27 DIAGNOSIS — I10 Essential (primary) hypertension: Secondary | ICD-10-CM | POA: Diagnosis not present

## 2019-10-27 LAB — POC OCCULT BLOOD, ED: Fecal Occult Bld: NEGATIVE

## 2019-10-27 LAB — CBC
HCT: 39.7 % (ref 36.0–46.0)
Hemoglobin: 12.4 g/dL (ref 12.0–15.0)
MCH: 24.7 pg — ABNORMAL LOW (ref 26.0–34.0)
MCHC: 31.2 g/dL (ref 30.0–36.0)
MCV: 79.1 fL — ABNORMAL LOW (ref 80.0–100.0)
Platelets: 372 10*3/uL (ref 150–400)
RBC: 5.02 MIL/uL (ref 3.87–5.11)
RDW: 15.9 % — ABNORMAL HIGH (ref 11.5–15.5)
WBC: 9.8 10*3/uL (ref 4.0–10.5)
nRBC: 0 % (ref 0.0–0.2)

## 2019-10-27 LAB — COMPREHENSIVE METABOLIC PANEL
ALT: 9 U/L (ref 0–44)
AST: 16 U/L (ref 15–41)
Albumin: 3.9 g/dL (ref 3.5–5.0)
Alkaline Phosphatase: 84 U/L (ref 38–126)
Anion gap: 12 (ref 5–15)
BUN: 9 mg/dL (ref 8–23)
CO2: 26 mmol/L (ref 22–32)
Calcium: 9.5 mg/dL (ref 8.9–10.3)
Chloride: 102 mmol/L (ref 98–111)
Creatinine, Ser: 0.75 mg/dL (ref 0.44–1.00)
GFR calc Af Amer: >60 mL/min (ref >60)
GFR calc non Af Amer: >60 mL/min (ref >60)
Glucose, Bld: 107 mg/dL — ABNORMAL HIGH (ref 70–99)
Potassium: 3.4 mmol/L — ABNORMAL LOW (ref 3.5–5.1)
Sodium: 140 mmol/L (ref 135–145)
Total Bilirubin: 0.7 mg/dL (ref 0.3–1.2)
Total Protein: 7.5 g/dL (ref 6.5–8.1)

## 2019-10-27 LAB — LIPASE, BLOOD: Lipase: 20 U/L (ref 11–51)

## 2019-10-27 MED ORDER — FLEET ENEMA 7-19 GM/118ML RE ENEM
1.0000 | ENEMA | Freq: Once | RECTAL | Status: AC
  Administered 2019-10-27: 1 via RECTAL

## 2019-10-27 MED ORDER — POLYETHYLENE GLYCOL 3350 17 G PO PACK: 17.0000 g | PACK | Freq: Every day | ORAL | 0 refills | Status: DC

## 2019-10-27 MED ORDER — BISACODYL 10 MG RE SUPP: 10.0000 mg | Freq: Once | RECTAL | Status: DC

## 2019-10-27 MED ORDER — IOHEXOL 300 MG/ML  SOLN
100.0000 mL | Freq: Once | INTRAMUSCULAR | Status: AC | PRN
  Administered 2019-10-27: 100 mL via INTRAVENOUS

## 2019-10-27 NOTE — ED Provider Notes (Signed)
[facility] [place] EMERGENCY DEPARTMENT Provider Note   CSN: [id number] Arrival date & time: 10/27/19  0932     History Chief Complaint  Patient presents with  . Diarrhea    Victoria Klein is a 86 y.o. female with a history of GERD, hypertension, prior history of colitis and diverticulitis, also history of chronic constipation but patient denies having this symptom recently developed diarrhea 2 days ago.  She describes very loose to watery stools, nonbloody, stating she can have a small stool every 5 minutes but has been trying to hold it to avoid having to run to the bathroom.  With these episodes she endorses a low abdominal cramping which resolves in between bowel movements.  She denies rectal pain or pressure and states her symptoms remind her of diverticulitis..  Also no fevers or chills, no nausea or vomiting.  She has had no medications prior to arrival for treatment of her diarrhea.  No recent travel, no recent antibiotics. she does endorse a dietary change as she ate some cherries 2 days ago, denies excessive amounts, but is somewhat suspicious this may be causing her symptoms.  The history is provided by the patient.       Past Medical History:  Diagnosis Date  . Chronic constipation   . Diverticulosis 12/11  . GERD (gastroesophageal reflux disease)   . H/O: Bell's palsy   . Hyperlipemia   . Hypertension   . S/P colonoscopy 12/11   Dr Georgiann Mohs fissure, mild colitis    Patient Active Problem List   Diagnosis Date Noted  . GERD (gastroesophageal reflux disease) 06/07/2012  . CONSTIPATION 03/11/2010  . RECTAL BLEEDING 03/11/2010  . ANAL OR RECTAL PAIN 03/11/2010    Past Surgical History:  Procedure Laterality Date  . ANKLE SURGERY     right   . APPENDECTOMY    . BACK SURGERY  1985  . COLONOSCOPY  04/04/2010   IZT:[id number] colon/rare diverticula/small internal hemorrhoids/anal fissure  . TOTAL HIP ARTHROPLASTY  1990's  . TRIGGER FINGER RELEASE    . tubal ligation        OB History   No obstetric history on file.     No family history on file.  Social History   Tobacco Use  . Smoking status: Current Every Day Smoker    Packs/day: 0.50    Types: Cigarettes  . Smokeless tobacco: Former Neurosurgeon    Quit date: 05/13/2009  Substance Use Topics  . Alcohol use: No  . Drug use: No    Home Medications Prior to Admission medications   Medication Sig Start Date End Date Taking? Authorizing Provider  amLODipine (NORVASC) 5 MG tablet Take 5 mg by mouth every morning.      [provider]  aspirin 325 MG tablet Take 325 mg by mouth daily.      [provider]  esomeprazole (NEXIUM) 40 MG packet Take 40 mg by mouth daily before breakfast. 10/02/14   Anice Paganini, NP  fenofibrate (TRICOR) 145 MG tablet Take 145 mg by mouth daily.  11/17/12   [provider]  glipiZIDE (GLUCOTROL) 5 MG tablet  11/17/12   [provider]  hydrochlorothiazide (,MICROZIDE/HYDRODIURIL,) 12.5 MG capsule Take 12.5 mg by mouth every morning.  08/28/10   [provider]  lubiprostone (AMITIZA) 24 MCG capsule Take 1 capsule (24 mcg total) by mouth 2 (two) times daily with a meal. 12/14/12   Fields, Sandi L, MD  polyethylene glycol (MIRALAX) 17 g packet Take 17 g by  mouth daily. 10/27/19   Burgess Amor, PA-C  predniSONE (DELTASONE) 10 MG tablet Take 2 tablets (20 mg total) by mouth 2 (two) times daily. 07/11/13   Mancel Bale, MD  TRAVATAN Z 0.004 % SOLN ophthalmic solution Place 1 drop into both eyes at bedtime.  11/17/12   [provider]    Allergies    Ace inhibitors and Pravastatin  Review of Systems   Review of Systems  Constitutional: Negative for chills and fever.  HENT: Negative for congestion and sore throat.   Eyes: Negative.   Respiratory: Negative for chest tightness and shortness of breath.   Cardiovascular: Negative for chest pain.  Gastrointestinal: Positive for abdominal pain and diarrhea. Negative for nausea and  vomiting.  Genitourinary: Negative.   Musculoskeletal: Negative for arthralgias, joint swelling and neck pain.  Skin: Negative.  Negative for rash and wound.  Neurological: Negative for dizziness, weakness, light-headedness, numbness and headaches.  Psychiatric/Behavioral: Negative.     Physical Exam Updated Vital Signs BP 120/67   Pulse 77   Temp 97.8 F (36.6 C) (Oral)   Resp 18   Ht 5\' 8"  (1.727 m)   Wt 72.6 kg   SpO2 96%   BMI 24.33 kg/m   Physical Exam Vitals and nursing note reviewed. Exam conducted with a chaperone present.  Constitutional:      Appearance: She is well-developed.  HENT:     Head: Normocephalic and atraumatic.  Eyes:     Conjunctiva/sclera: Conjunctivae normal.  Cardiovascular:     Rate and Rhythm: Normal rate and regular rhythm.     Heart sounds: Normal heart sounds.  Pulmonary:     Effort: Pulmonary effort is normal.     Breath sounds: Normal breath sounds. No wheezing.  Abdominal:     General: Bowel sounds are normal.     Palpations: Abdomen is soft.     Tenderness: There is abdominal tenderness in the suprapubic area and left lower quadrant. There is no guarding or rebound.  Genitourinary:    Rectum: Guaiac result negative. No tenderness.     Comments: Soft stool in rectum, firmer stool more proximally, no obvious impaction. Musculoskeletal:        General: Normal range of motion.     Cervical back: Normal range of motion.  Skin:    General: Skin is warm and dry.  Neurological:     Mental Status: She is alert.     ED Results / Procedures / Treatments   Labs (all labs ordered are listed, but only abnormal results are displayed) Labs Reviewed  COMPREHENSIVE METABOLIC PANEL - Abnormal; Notable for the following components:      Result Value   Potassium 3.4 (*)    Glucose, Bld 107 (*)    All other components within normal limits  CBC - Abnormal; Notable for the following components:   MCV 79.1 (*)    MCH 24.7 (*)    RDW 15.9 (*)      All other components within normal limits  LIPASE, BLOOD  URINALYSIS, ROUTINE W REFLEX MICROSCOPIC  POC OCCULT BLOOD, ED    EKG None  Radiology CT ABDOMEN PELVIS W CONTRAST  Result Date: 10/27/2019 CLINICAL DATA:  Low abdominal pain with bowel movements for 2 weeks. Diarrhea for 2 days. Suspected diverticulitis. EXAM: CT ABDOMEN AND PELVIS WITH CONTRAST TECHNIQUE: Multidetector CT imaging of the abdomen and pelvis was performed using the standard protocol following bolus administration of intravenous contrast. CONTRAST:  12/28/2019 OMNIPAQUE IOHEXOL 300 MG/ML  SOLN COMPARISON:  None. FINDINGS: Lower chest: Clear lung bases. No significant pleural or pericardial effusion. There is minimal calcified pleural plaque inferiorly in the right hemithorax. There is diffuse aortic and coronary artery atherosclerosis with calcifications of the aortic valve. Hepatobiliary: The liver is normal in density without suspicious focal abnormality. No evidence of gallstones, gallbladder wall thickening or biliary dilatation. Pancreas: Unremarkable. No pancreatic ductal dilatation or surrounding inflammatory changes. Spleen: Probable small cyst inferiorly in the spleen on image 27/2. Otherwise unremarkable. Adrenals/Urinary Tract: Both adrenal glands appear normal. Simple and parapelvic cysts in the lower pole of the right kidney. No evidence of renal mass, urinary tract calculus or hydronephrosis. The bladder is partly obscured by artifact from the bilateral hip arthroplasties, although demonstrates no abnormality. Stomach/Bowel: No evidence of bowel wall thickening, distention or surrounding inflammatory change. The appendix is not clearly seen. There are no pericecal inflammatory changes to suggest appendicitis. There is moderate stool throughout the colon, especially in the rectum. Vascular/Lymphatic: There are no enlarged abdominal or pelvic lymph nodes. Extensive aortic and branch vessel atherosclerosis with associated  soft and calcified plaque. No large vessel occlusion identified. The portal, superior mesenteric and splenic veins are patent. Reproductive: The uterus and ovaries demonstrate no significant findings. Other: No evidence of abdominal wall mass or hernia. No ascites. Musculoskeletal: No acute or significant osseous findings. Multilevel spondylosis, most advanced at L4-5 and L5-S1. Bilateral total hip arthroplasty. IMPRESSION: 1. No acute findings or explanation for the patient's symptoms. No evidence of diverticulitis or colitis. There is moderate stool throughout the colon, especially in the rectum, suggesting constipation. 2. Extensive aortic Atherosclerosis (ICD10-I70.0). Electronically Signed   By: Carey Bullocks M.D.   On: 10/27/2019 13:18    Procedures Procedures (including critical care time)  Medications Ordered in ED Medications  iohexol (OMNIPAQUE) 300 MG/ML solution 100 mL (100 mLs Intravenous Contrast Given 10/27/19 1236)  sodium phosphate (FLEET) 7-19 GM/118ML enema 1 enema (1 enema Rectal Given 10/27/19 1436)    ED Course  I have reviewed the triage vital signs and the nursing notes.  Pertinent labs & imaging results that were available during my care of the patient were reviewed by me and considered in my medical decision making (see chart for details).    MDM Rules/Calculators/A&P                         Lab work and CT imaging reviewed and discussed with patient and daughter at the bedside. Patient was suspected overflow diarrhea from constipation.  She was given an enema here and had a large bowel movement after which she felt much improved.  She was prescribed MiraLAX for daily use to treat her constipation.  As needed follow-up with PCP anticipated.  Return precautions outlined. Final Clinical Impression(s) / ED Diagnoses Final diagnoses:  Chronic constipation with overflow    Rx / DC Orders ED Discharge Orders         Ordered    polyethylene glycol (MIRALAX) 17 g packet   Daily     Discontinue  Reprint     10/27/19 1602           Burgess Amor, PA-C 10/27/19 Tora Perches, MD 10/30/19 [phone]

## 2019-10-27 NOTE — ED Triage Notes (Signed)
Pt c/o diarrhea x 2 days and pain with having a BM

## 2019-10-27 NOTE — ED Notes (Signed)
Pt had a large amount of brown stool after enema. Pt reports she feels much better.

## 2019-10-27 NOTE — Discharge Instructions (Signed)
Your labs and CT scan are stable today.  As discussed, you actually were fairly constipated but had overflow diarrhea which means there was loose stool coming around a large hard stool bolus making it seem like you were having diarrhea.  I have prescribed MiraLAX which is a powder for you to mix in 8 ounces of fluid of choice daily which is safe to take daily and should help you with your constipation.  Please plan to see your primary doctor for any further problems or concerns, return here for recheck for any worsening symptoms.

## 2020-07-24 IMAGING — CT CT ABD-PELV W/ CM
2 of 5 series · 15 of 46 positions shown, 17 images · IV contrast (Omnipaque or Isovue)
Comparison: None.

CLINICAL DATA: Low abdominal pain with bowel movements for 2 weeks.
Diarrhea for 2 days. Suspected diverticulitis.

EXAM:
CT ABDOMEN AND PELVIS WITH CONTRAST
TECHNIQUE: Multidetector CT imaging of the abdomen and pelvis was performed
using the standard protocol following bolus administration of
intravenous contrast.
CONTRAST:  100mL OMNIPAQUE IOHEXOL 300 MG/ML  SOLN

[Series 2: axial st · axial · 0.74mm/px · z∈[+688,+1108]mm · 12 of 96 slices shown, 14 images]
[im 6/96  soft-tissue]
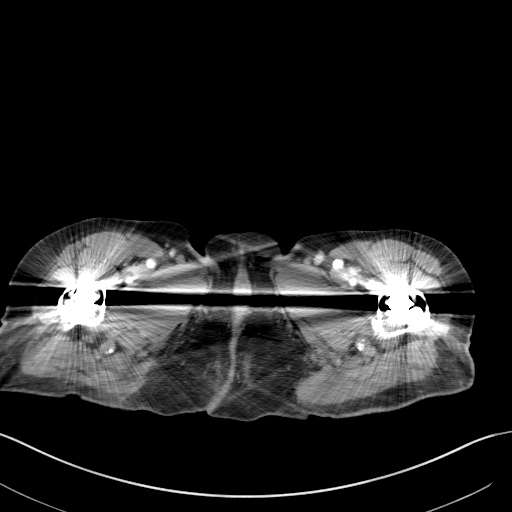
[im 6/96  bone]
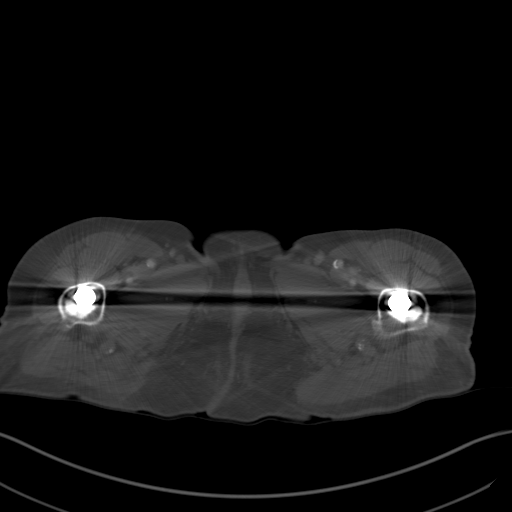
[im 12/96  soft-tissue]
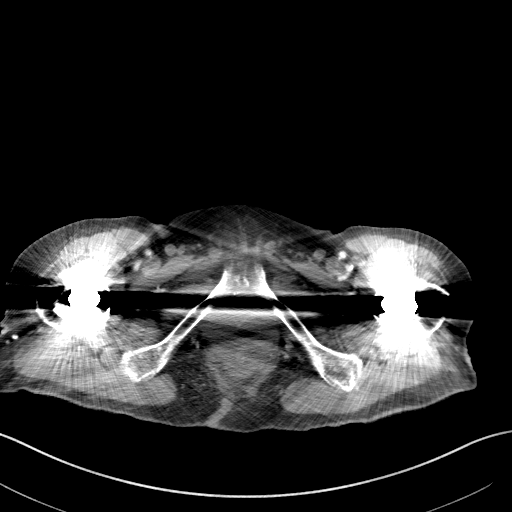
[im 24/96  soft-tissue]
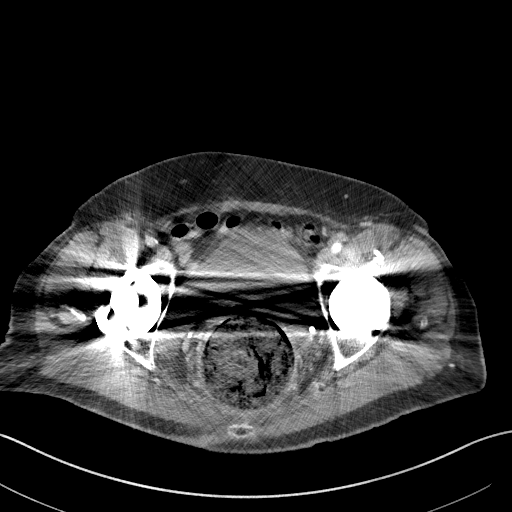
[im 30/96  soft-tissue]
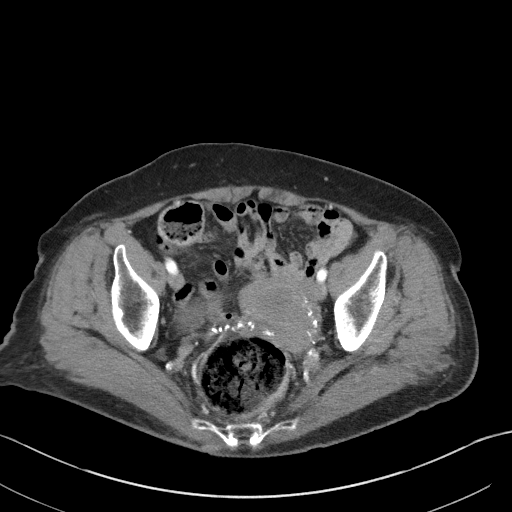
[im 36/96  soft-tissue]
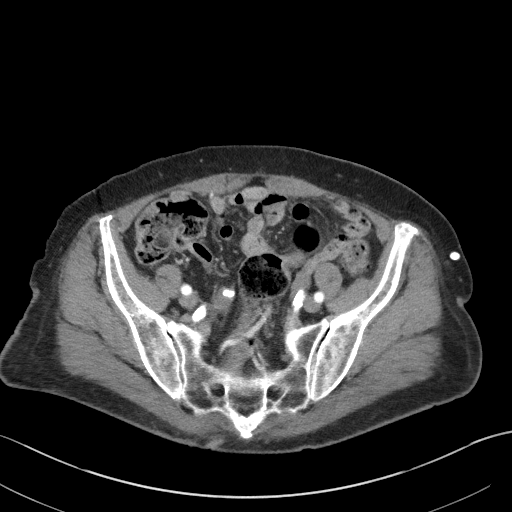
[im 42/96  soft-tissue]
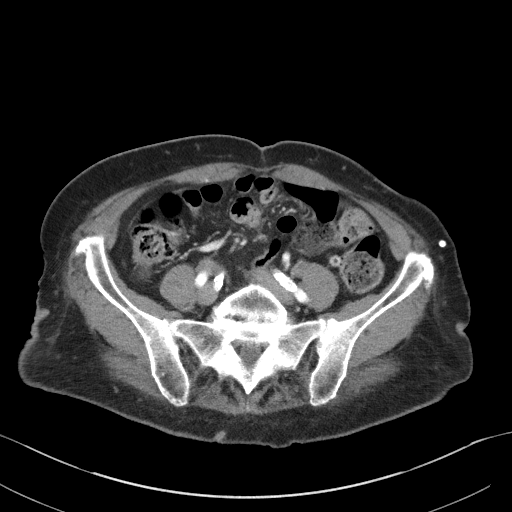
[im 54/96  soft-tissue]
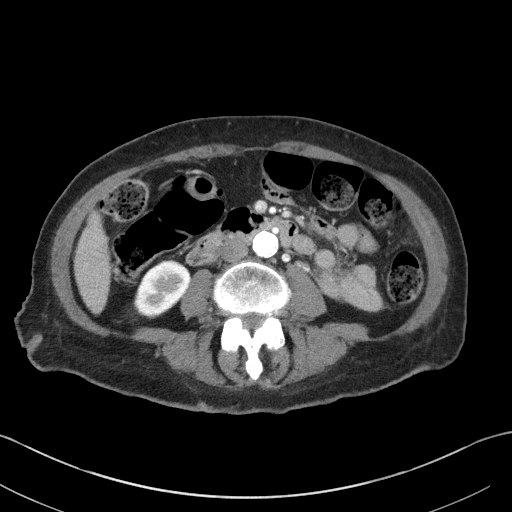
[im 60/96  soft-tissue]
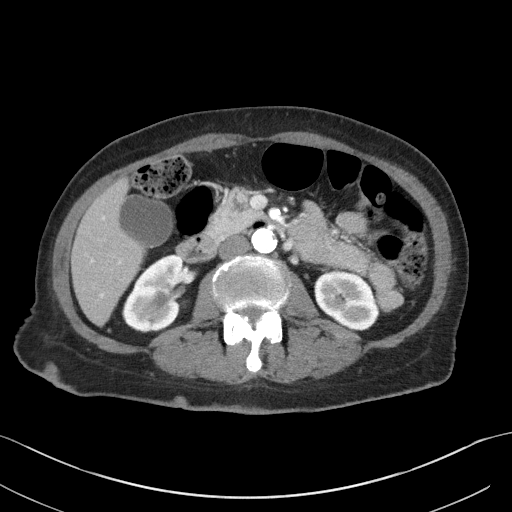
[im 66/96  soft-tissue]
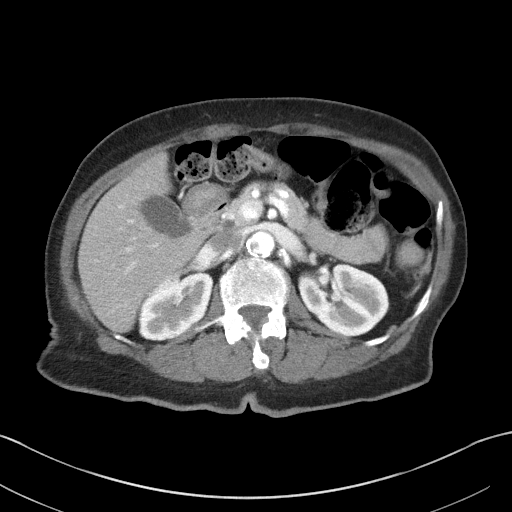
[im 66/96  bone]
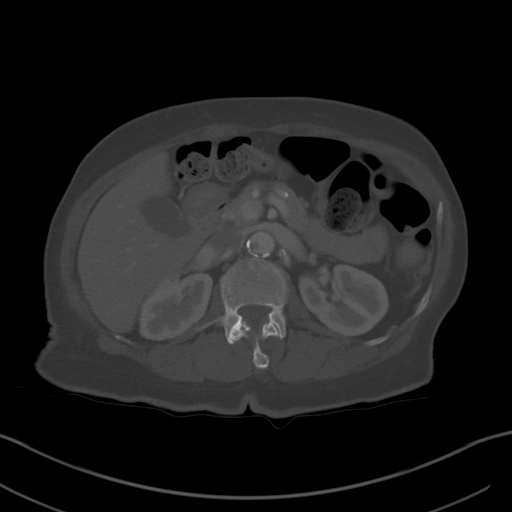
[im 72/96  soft-tissue]
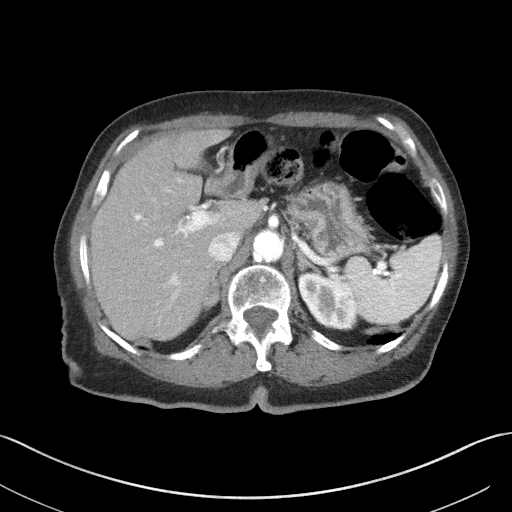
[im 84/96  soft-tissue]
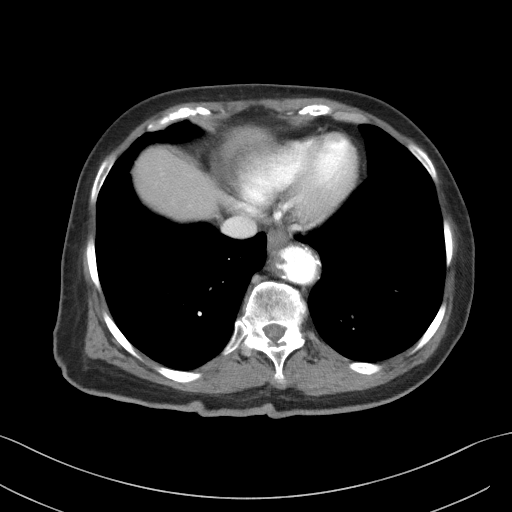
[im 90/96  soft-tissue]
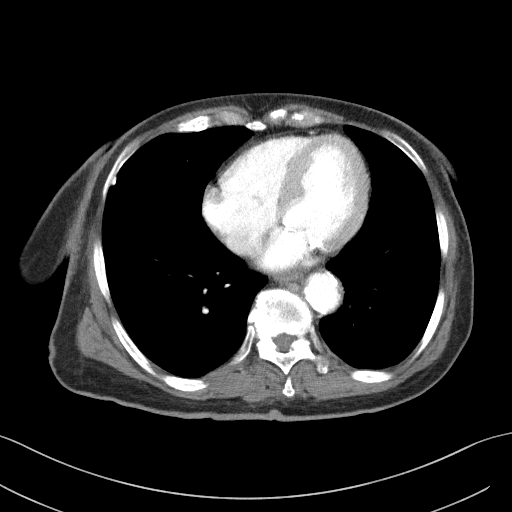

[Series 6: coronal st · coronal · 0.75mm/px · 3 of 96 slices shown]
[im 32/96  soft-tissue]
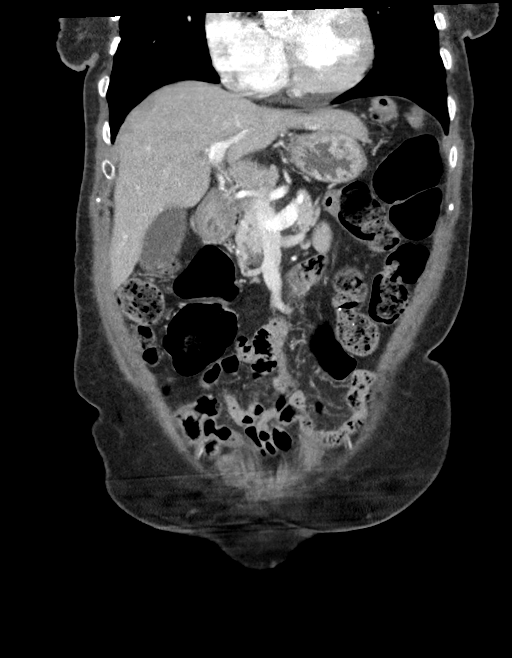
[im 43/96  soft-tissue]
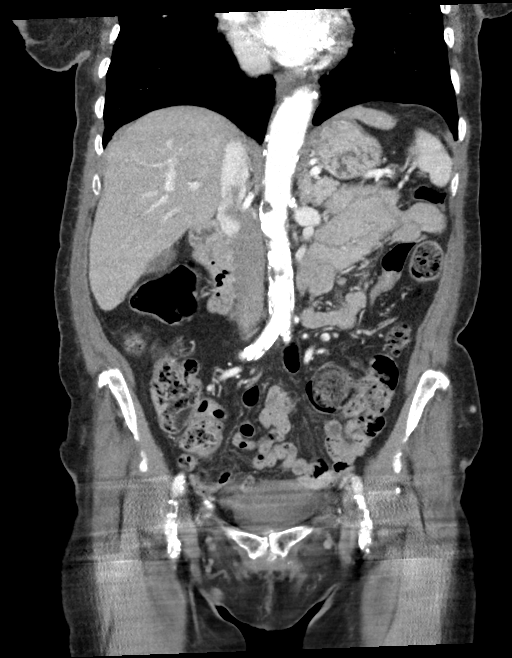
[im 53/96  soft-tissue]
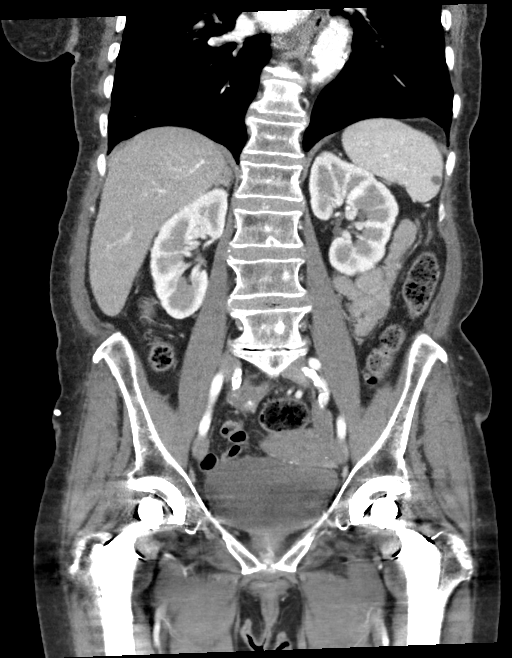

[15 of 46 positions shown; findings below may reference images not displayed]

FINDINGS: Lower chest: Clear lung bases. No significant pleural or pericardial
effusion. There is minimal calcified pleural plaque inferiorly in
the right hemithorax. There is diffuse aortic and coronary artery
atherosclerosis with calcifications of the aortic valve.

Hepatobiliary: The liver is normal in density without suspicious
focal abnormality. No evidence of gallstones, gallbladder wall
thickening or biliary dilatation.

Pancreas: Unremarkable. No pancreatic ductal dilatation or
surrounding inflammatory changes.

Spleen: Probable small cyst inferiorly in the spleen on image [DATE].
Otherwise unremarkable.

Adrenals/Urinary Tract: Both adrenal glands appear normal. Simple
and parapelvic cysts in the lower pole of the right kidney. No
evidence of renal mass, urinary tract calculus or hydronephrosis.
The bladder is partly obscured by artifact from the bilateral hip
arthroplasties, although demonstrates no abnormality.

Stomach/Bowel: No evidence of bowel wall thickening, distention or
surrounding inflammatory change. The appendix is not clearly seen.
There are no pericecal inflammatory changes to suggest appendicitis.
There is moderate stool throughout the colon, especially in the
rectum.

Vascular/Lymphatic: There are no enlarged abdominal or pelvic lymph
nodes. Extensive aortic and branch vessel atherosclerosis with
associated soft and calcified plaque. No large vessel occlusion
identified. The portal, superior mesenteric and splenic veins are
patent.

Reproductive: The uterus and ovaries demonstrate no significant
findings.

Other: No evidence of abdominal wall mass or hernia. No ascites.

Musculoskeletal: No acute or significant osseous findings.
Multilevel spondylosis, most advanced at L4-5 and L5-S1. Bilateral
total hip arthroplasty.
IMPRESSION: 1. No acute findings or explanation for the patient's symptoms. No
evidence of diverticulitis or colitis. There is moderate stool
throughout the colon, especially in the rectum, suggesting
constipation.
2. Extensive aortic Atherosclerosis ([id number]).

## 2021-12-16 ENCOUNTER — Other Ambulatory Visit (HOSPITAL_COMMUNITY): Payer: Self-pay | Admitting: Internal Medicine

## 2021-12-16 ENCOUNTER — Other Ambulatory Visit: Payer: Self-pay | Admitting: Internal Medicine

## 2021-12-16 DIAGNOSIS — R718 Other abnormality of red blood cells: Secondary | ICD-10-CM

## 2021-12-16 DIAGNOSIS — Z87891 Personal history of nicotine dependence: Secondary | ICD-10-CM

## 2021-12-16 DIAGNOSIS — R634 Abnormal weight loss: Secondary | ICD-10-CM

## 2021-12-16 DIAGNOSIS — I10 Essential (primary) hypertension: Secondary | ICD-10-CM

## 2021-12-18 ENCOUNTER — Other Ambulatory Visit (HOSPITAL_COMMUNITY): Payer: Self-pay | Admitting: Internal Medicine

## 2021-12-18 DIAGNOSIS — R634 Abnormal weight loss: Secondary | ICD-10-CM

## 2021-12-18 DIAGNOSIS — K5900 Constipation, unspecified: Secondary | ICD-10-CM

## 2021-12-18 DIAGNOSIS — Z87891 Personal history of nicotine dependence: Secondary | ICD-10-CM

## 2022-02-17 ENCOUNTER — Encounter (HOSPITAL_COMMUNITY): Payer: Self-pay | Admitting: Internal Medicine

## 2022-02-17 ENCOUNTER — Other Ambulatory Visit (HOSPITAL_COMMUNITY): Payer: Self-pay | Admitting: Internal Medicine

## 2022-02-17 DIAGNOSIS — F17200 Nicotine dependence, unspecified, uncomplicated: Secondary | ICD-10-CM

## 2022-02-17 DIAGNOSIS — J410 Simple chronic bronchitis: Secondary | ICD-10-CM

## 2022-02-17 DIAGNOSIS — E119 Type 2 diabetes mellitus without complications: Secondary | ICD-10-CM

## 2022-02-17 DIAGNOSIS — R634 Abnormal weight loss: Secondary | ICD-10-CM

## 2022-02-20 ENCOUNTER — Other Ambulatory Visit (HOSPITAL_COMMUNITY): Payer: Self-pay | Admitting: Internal Medicine

## 2022-02-20 ENCOUNTER — Other Ambulatory Visit: Payer: Self-pay | Admitting: Internal Medicine

## 2022-02-20 DIAGNOSIS — R634 Abnormal weight loss: Secondary | ICD-10-CM

## 2022-02-20 DIAGNOSIS — F17200 Nicotine dependence, unspecified, uncomplicated: Secondary | ICD-10-CM

## 2023-05-14 ENCOUNTER — Emergency Department (HOSPITAL_COMMUNITY): Payer: Medicare HMO

## 2023-05-14 ENCOUNTER — Observation Stay (HOSPITAL_COMMUNITY)
Admission: EM | Admit: 2023-05-14 | Discharge: 2023-05-15 | Disposition: A | Discharge status: Home / Self Care | Payer: Medicare HMO | Attending: Family Medicine | Admitting: Family Medicine

## 2023-05-14 ENCOUNTER — Other Ambulatory Visit: Payer: Self-pay

## 2023-05-14 ENCOUNTER — Encounter (HOSPITAL_COMMUNITY): Payer: Self-pay | Admitting: Emergency Medicine

## 2023-05-14 ENCOUNTER — Observation Stay (HOSPITAL_COMMUNITY): Payer: Medicare HMO

## 2023-05-14 DIAGNOSIS — I447 Left bundle-branch block, unspecified: Secondary | ICD-10-CM | POA: Diagnosis present

## 2023-05-14 DIAGNOSIS — I10 Essential (primary) hypertension: Secondary | ICD-10-CM | POA: Diagnosis not present

## 2023-05-14 DIAGNOSIS — R55 Syncope and collapse: Secondary | ICD-10-CM | POA: Diagnosis present

## 2023-05-14 DIAGNOSIS — R011 Cardiac murmur, unspecified: Secondary | ICD-10-CM

## 2023-05-14 DIAGNOSIS — K5900 Constipation, unspecified: Secondary | ICD-10-CM | POA: Diagnosis not present

## 2023-05-14 DIAGNOSIS — K219 Gastro-esophageal reflux disease without esophagitis: Secondary | ICD-10-CM | POA: Diagnosis not present

## 2023-05-14 DIAGNOSIS — E782 Mixed hyperlipidemia: Secondary | ICD-10-CM | POA: Diagnosis not present

## 2023-05-14 DIAGNOSIS — R2689 Other abnormalities of gait and mobility: Secondary | ICD-10-CM | POA: Diagnosis not present

## 2023-05-14 DIAGNOSIS — I350 Nonrheumatic aortic (valve) stenosis: Secondary | ICD-10-CM

## 2023-05-14 DIAGNOSIS — F17210 Nicotine dependence, cigarettes, uncomplicated: Secondary | ICD-10-CM | POA: Diagnosis not present

## 2023-05-14 DIAGNOSIS — H409 Unspecified glaucoma: Secondary | ICD-10-CM | POA: Diagnosis not present

## 2023-05-14 DIAGNOSIS — Z79899 Other long term (current) drug therapy: Secondary | ICD-10-CM | POA: Insufficient documentation

## 2023-05-14 DIAGNOSIS — E785 Hyperlipidemia, unspecified: Secondary | ICD-10-CM | POA: Diagnosis not present

## 2023-05-14 LAB — CBC
HCT: 34.6 % — ABNORMAL LOW (ref 36.0–46.0)
Hemoglobin: 10.8 g/dL — ABNORMAL LOW (ref 12.0–15.0)
MCH: 25.6 pg — ABNORMAL LOW (ref 26.0–34.0)
MCHC: 31.2 g/dL (ref 30.0–36.0)
MCV: 82 fL (ref 80.0–100.0)
Platelets: 368 10*3/uL (ref 150–400)
RBC: 4.22 MIL/uL (ref 3.87–5.11)
RDW: 14.9 % (ref 11.5–15.5)
WBC: 9.8 10*3/uL (ref 4.0–10.5)
nRBC: 0 % (ref 0.0–0.2)

## 2023-05-14 LAB — CBG MONITORING, ED: Glucose-Capillary: 114 mg/dL — ABNORMAL HIGH (ref 70–99)

## 2023-05-14 LAB — BASIC METABOLIC PANEL
Anion gap: 7 (ref 5–15)
BUN: 20 mg/dL (ref 8–23)
CO2: 28 mmol/L (ref 22–32)
Calcium: 9.1 mg/dL (ref 8.9–10.3)
Chloride: 102 mmol/L (ref 98–111)
Creatinine, Ser: 1.06 mg/dL — ABNORMAL HIGH (ref 0.44–1.00)
GFR, Estimated: 50 mL/min — ABNORMAL LOW (ref >60)
Glucose, Bld: 104 mg/dL — ABNORMAL HIGH (ref 70–99)
Potassium: 3.6 mmol/L (ref 3.5–5.1)
Sodium: 137 mmol/L (ref 135–145)

## 2023-05-14 MED ORDER — ALUM & MAG HYDROXIDE-SIMETH 200-200-20 MG/5ML PO SUSP: 30.0000 mL | Freq: Four times a day (QID) | ORAL | Status: DC | PRN

## 2023-05-14 MED ORDER — FAMOTIDINE 20 MG PO TABS
20.0000 mg | ORAL_TABLET | Freq: Every day | ORAL | Status: DC
  Administered 2023-05-14: 20 mg via ORAL
  Filled 2023-05-14: qty 1

## 2023-05-14 MED ORDER — ONDANSETRON HCL 4 MG/2ML IJ SOLN: 4.0000 mg | Freq: Four times a day (QID) | INTRAMUSCULAR | Status: DC | PRN

## 2023-05-14 MED ORDER — SODIUM CHLORIDE 0.9 % IV SOLN: INTRAVENOUS | Status: DC

## 2023-05-14 MED ORDER — SODIUM CHLORIDE 0.9% FLUSH
3.0000 mL | Freq: Two times a day (BID) | INTRAVENOUS | Status: DC
  Administered 2023-05-14: 3 mL via INTRAVENOUS

## 2023-05-14 MED ORDER — ONDANSETRON HCL 4 MG PO TABS: 4.0000 mg | ORAL_TABLET | Freq: Four times a day (QID) | ORAL | Status: DC | PRN

## 2023-05-14 MED ORDER — HYDRALAZINE HCL 20 MG/ML IJ SOLN: 5.0000 mg | INTRAMUSCULAR | Status: DC | PRN

## 2023-05-14 MED ORDER — ACETAMINOPHEN 650 MG RE SUPP: 650.0000 mg | Freq: Four times a day (QID) | RECTAL | Status: DC | PRN

## 2023-05-14 MED ORDER — ENOXAPARIN SODIUM 40 MG/0.4ML IJ SOSY
40.0000 mg | PREFILLED_SYRINGE | INTRAMUSCULAR | Status: DC
  Administered 2023-05-14: 40 mg via SUBCUTANEOUS
  Filled 2023-05-14: qty 0.4

## 2023-05-14 MED ORDER — BISACODYL 5 MG PO TBEC: 5.0000 mg | DELAYED_RELEASE_TABLET | Freq: Every day | ORAL | Status: DC | PRN

## 2023-05-14 MED ORDER — ACETAMINOPHEN 325 MG PO TABS: 650.0000 mg | ORAL_TABLET | Freq: Four times a day (QID) | ORAL | Status: DC | PRN

## 2023-05-14 MED ORDER — TRAZODONE HCL 50 MG PO TABS: 25.0000 mg | ORAL_TABLET | Freq: Every evening | ORAL | Status: DC | PRN

## 2023-05-14 NOTE — H&P (Signed)
History and Physical  [facility]  Shatira Poppa YNW:[id number] DOB: 1931/09/25 DOA: 05/14/2023  PCP: [facility]  Patient coming from: Home  Level of care: Telemetry  I have personally briefly reviewed patient's old medical records in [facility]  Chief Complaint: almost passed out  HPI: Victoria Klein is a 89 year old female with hypertension, GERD, hyperlipidemia, history of right-sided Bell's palsy, glaucoma reportedly has been having intermittent episodes of near syncope associated with diaphoresis but no chest pain or shortness of breath.  Patient reportedly has lost 100 pounds in the last 2 years.  She was shopping in [place] today and had another episode of lightheadedness and diaphoresis and had to sit down.  She felt like she was going to pass out but she did not.  She had no chest pain or shortness of breath with the episode.  These episodes have been occurring for the last 6 months.  The episodes occur at rest and with ambulation.  She does not have dizziness with the episodes.  She does not have a change in vision with the episodes.  There is no nausea or vomiting or diarrhea.  In the ED she was evaluated and noted to have a holosystolic murmur.  Patient reports no history of this.  Patient is being admitted for further management and workup of the symptoms of near syncope and murmur.    Past Medical History:  Diagnosis Date   Chronic constipation    Diverticulosis 12/11   GERD (gastroesophageal reflux disease)    H/O: Bell's palsy    Hyperlipemia    Hypertension    S/P colonoscopy 12/11   Dr Georgiann Mohs fissure, mild colitis    Past Surgical History:  Procedure Laterality Date   ANKLE SURGERY     right    APPENDECTOMY     BACK SURGERY  1985   COLONOSCOPY  04/04/2010   [facility]:QIONGEX colon/rare diverticula/small internal hemorrhoids/anal fissure   TOTAL HIP ARTHROPLASTY  1990's   TRIGGER FINGER RELEASE     tubal ligation        reports that she has been smoking cigarettes. She quit smokeless tobacco use about 14 years ago. She reports that she does not drink alcohol and does not use drugs.  Allergies  Allergen Reactions   Ace Inhibitors Other (See Comments)    Arm heaviness   Pravastatin Other (See Comments)    Muscle Pain    History reviewed. No pertinent family history.  Prior to Admission medications   Medication Sig Start Date End Date Taking? Authorizing Provider  ezetimibe (ZETIA) 10 MG tablet Take 10 mg by mouth daily. 05/13/23   [provider]  hydrochlorothiazide (HYDRODIURIL) 25 MG tablet Take 25 mg by mouth daily. 05/13/23   [provider]  amLODipine (NORVASC) 5 MG tablet Take 5 mg by mouth every morning.      [provider]  esomeprazole (NEXIUM) 40 MG packet Take 40 mg by mouth daily before breakfast. 10/02/14   Anice Paganini, NP  latanoprost (XALATAN) 0.005 % ophthalmic solution Place 1 drop into both eyes at bedtime. 05/13/23  Yes [provider]  losartan (COZAAR) 25 MG tablet Take 25 mg by mouth daily.    [provider]  lubiprostone (AMITIZA) 24 MCG capsule Take 1 capsule (24 mcg total) by mouth 2 (two) times daily with a meal. 12/14/12   Fields, Sandi L, MD  polyethylene glycol (MIRALAX) 17 g packet Take 17 g by mouth daily. 10/27/19  Burgess Amor, PA-C    Physical Exam: Vitals:   05/14/23 1300 05/14/23 1330 05/14/23 1400 05/14/23 1430  BP: (!) 147/64 (!) 150/61 (!) 143/60 (!) 141/65  Pulse: (!) 59 65 67 71  Resp: 18 17 17 16   Temp:      TempSrc:      SpO2: 95% 96% 97% 97%  Weight:      Height:       Constitutional: very thin female, awake, alert, cooperative. NAD, calm, comfortable Eyes: PERRL, lids and conjunctivae normal ENMT: Mucous membranes are moist. Posterior pharynx clear of any exudate or lesions.  Neck: normal, supple, no masses, no thyromegaly Respiratory: clear to auscultation bilaterally, no wheezing, no crackles. Normal  respiratory effort. No accessory muscle use.  Cardiovascular: normal s1, s2 sounds, loud holosystolic murmurs, No rubs / gallops. No extremity edema. 2+ pedal pulses. No carotid bruits.  Abdomen: no tenderness, no masses palpated. No hepatosplenomegaly. Bowel sounds positive.  Musculoskeletal: no clubbing / cyanosis. No joint deformity upper and lower extremities. Good ROM, no contractures. Normal muscle tone.  Skin: no rashes, lesions, ulcers. No induration Neurologic: CN 2-12 grossly intact. Sensation intact, DTR normal. Strength 5/5 in all 4.  Psychiatric: Normal judgment and insight. Alert and oriented x 3. Normal mood.   Labs on Admission: I have personally reviewed following labs and imaging studies  CBC: Recent Labs  Lab 05/14/23 1219  WBC 9.8  HGB 10.8*  HCT 34.6*  MCV 82.0  PLT 368   Basic Metabolic Panel: Recent Labs  Lab 05/14/23 1219  NA 137  K 3.6  CL 102  CO2 28  GLUCOSE 104*  BUN 20  CREATININE 1.06*  CALCIUM 9.1   GFR: Estimated Creatinine Clearance: 31.2 mL/min (A) (by C-G formula based on SCr of 1.06 mg/dL (H)). Liver Function Tests: No results for input(s): "AST", "ALT", "ALKPHOS", "BILITOT", "PROT", "ALBUMIN" in the last 168 hours. No results for input(s): "LIPASE", "AMYLASE" in the last 168 hours. No results for input(s): "AMMONIA" in the last 168 hours. Coagulation Profile: No results for input(s): "INR", "PROTIME" in the last 168 hours. Cardiac Enzymes: No results for input(s): "CKTOTAL", "CKMB", "CKMBINDEX", "TROPONINI" in the last 168 hours. BNP (last 3 results) No results for input(s): "PROBNP" in the last 8760 hours. HbA1C: No results for input(s): "HGBA1C" in the last 72 hours. CBG: Recent Labs  Lab 05/14/23 1116  GLUCAP 114*   Lipid Profile: No results for input(s): "CHOL", "HDL", "LDLCALC", "TRIG", "CHOLHDL", "LDLDIRECT" in the last 72 hours. Thyroid Function Tests: No results for input(s): "TSH", "T4TOTAL", "FREET4", "T3FREE",  "THYROIDAB" in the last 72 hours. Anemia Panel: No results for input(s): "VITAMINB12", "FOLATE", "FERRITIN", "TIBC", "IRON", "RETICCTPCT" in the last 72 hours. Urine analysis:    Component Value Date/Time   COLORURINE YELLOW 11/15/2009 1750   APPEARANCEUR CLEAR 11/15/2009 1750   LABSPEC 1.025 11/15/2009 1750   PHURINE 6.0 11/15/2009 1750   GLUCOSEU NEGATIVE 11/15/2009 1750   HGBUR NEGATIVE 11/15/2009 1750   BILIRUBINUR NEGATIVE 11/15/2009 1750   KETONESUR NEGATIVE 11/15/2009 1750   PROTEINUR NEGATIVE 11/15/2009 1750   UROBILINOGEN 0.2 11/15/2009 1750   NITRITE NEGATIVE 11/15/2009 1750   LEUKOCYTESUR  11/15/2009 1750    NEGATIVE MICROSCOPIC NOT DONE ON URINES WITH NEGATIVE PROTEIN, BLOOD, LEUKOCYTES, NITRITE, OR GLUCOSE <1000 mg/dL.   Radiological Exams on Admission: DG Chest Portable 1 View Result Date: 05/14/2023 CLINICAL DATA:  near syncope, murmur on exam.  Near-syncope. EXAM: PORTABLE CHEST 1 VIEW COMPARISON:  11/15/2009. FINDINGS: Bilateral lung fields are  clear. Bilateral costophrenic angles are clear. Stable cardio-mediastinal silhouette. No acute osseous abnormalities. The soft tissues are within normal limits. IMPRESSION: No active disease. Electronically Signed   By: Jules Schick M.D.   On: 05/14/2023 14:23   EKG: Independently reviewed. LBBB  Assessment/Plan Principal Problem:   Near syncope Active Problems:   Constipation   GERD (gastroesophageal reflux disease)   Heart murmur   LBBB (left bundle branch block)   Essential hypertension   Glaucoma   Hyperlipidemia   Near syncope - recurrent episodes per patient - gently hydrate with IV fluids - follow up orthostatic testing - hold hydrochlorothiazide - carotid US studies - TTE to evaluate heart murmur  - if unrevealing would ask for a 30 day cardiac monitor  Essential hypertension  - pt not sure of any of her normal medications - try to get her home meds reconciled if possible - IV meds PRN elevated BPs    Hyperlipidemia - not sure what meds she is taking  - she can resume them at discharge since she is observation  LBBB - no CP symptoms - will have her follow up with cardiology at DC - monitor on telemetry while in hospital   GERD - famotidine daily for GI protection   Heart Murmur - harsh holosystolic murmur  - check TTE study to eval for severe AS   DVT prophylaxis: enoxaparin   Code Status: Full   Family Communication: not present during rounds  Disposition Plan: anticipate DC home tomorrow   Consults called:   Admission status: OBV  Level of care: Telemetry Standley Dakins MD [facility] How to contact the [facility] Attending or Consulting provider 7A - 7P or covering provider during after hours 7P -7A, for this patient?  Check the care team in [facility] and look for a) attending/consulting TR[facility] provider listed and b) the [facility] team listed Log into [email or web address] and use [facility]'s universal password to access. If you do not have the password, please contact the hospital operator. Locate the [facility] provider you are looking for under [facility] and page to a number that you can be directly reached. If you still have difficulty reaching the provider, please page the [facility] (Director on Call) for the Hospitalists listed on amion for assistance.   If 7PM-7AM, please contact night-coverage [email or web address] Password [facility]  05/14/2023, 3:03 PM

## 2023-05-14 NOTE — ED Triage Notes (Signed)
Pt states she was in [employer] today and became hot and dizzy. Pt states she has not had anything to eat today & denies being diabetic. Pts family states this is not new and has happened before. Pt denies any symptoms currently in triage.

## 2023-05-14 NOTE — Hospital Course (Addendum)
89 year old female with hypertension, GERD, hyperlipidemia, history of right-sided Bell's palsy, glaucoma reportedly has been having intermittent episodes of near syncope associated with diaphoresis but no chest pain or shortness of breath.  Patient reportedly has lost 100 pounds in the last 2 years.  She was shopping in [place] today and had another episode of lightheadedness and diaphoresis and had to sit down.  She felt like she was going to pass out but she did not.  She had no chest pain or shortness of breath with the episode.  These episodes have been occurring for the last 6 months.  The episodes occur at rest and with ambulation.  She does not have dizziness with the episodes.  She does not have a change in vision with the episodes.  There is no nausea or vomiting or diarrhea.  In the ED she was evaluated and noted to have a holosystolic murmur.  Patient reports no history of this.  Patient is being admitted for further management and workup of the symptoms of near syncope and murmur.

## 2023-05-14 NOTE — Plan of Care (Signed)

## 2023-05-14 NOTE — ED Provider Notes (Signed)
[place] EMERGENCY DEPARTMENT AT [facility] Provider Note   CSN: [id number] Arrival date & time: 05/14/23  1019     History  Chief Complaint  Patient presents with   Weakness    Victoria Klein is a 89 y.o. female with a history including hypertension, GERD, former diabetic but is no longer on diabetes medicines, reporting a near 100 pound weight loss within the last 2 years since stopping her diabetes medications presenting for evaluation of lightheadedness.  She was at [employer] shopping today when she became hot, lightheaded and had to sit down or she was afraid she was going to fall down.  She states she has similar episodes in the past, stating this has going on for 6 months or longer, several times per week, she endorses this can occur with ambulation but also at rest stating the last episode she had was at home when she was sitting and had not been exerting.  She denies headache, ear pain, she does not have vertiginous like symptoms, she describes feeling weak but not dizzy with these episodes.  Denies chest pain, shortness of breath, abdominal pain, nausea or vomiting.  The history is provided by the patient.       Home Medications Prior to Admission medications   Medication Sig Start Date End Date Taking? Authorizing Provider  ezetimibe (ZETIA) 10 MG tablet Take 10 mg by mouth daily. 05/13/23  Yes [provider]  hydrochlorothiazide (HYDRODIURIL) 25 MG tablet Take 25 mg by mouth daily. 05/13/23  Yes [provider]  amLODipine (NORVASC) 5 MG tablet Take 5 mg by mouth every morning.      [provider]  aspirin 325 MG tablet Take 325 mg by mouth daily.      [provider]  esomeprazole (NEXIUM) 40 MG packet Take 40 mg by mouth daily before breakfast. 10/02/14   Anice Paganini, NP  fenofibrate (TRICOR) 145 MG tablet Take 145 mg by mouth daily.  11/17/12   [provider]  glipiZIDE (GLUCOTROL) 5 MG tablet  11/17/12   [provider]  latanoprost (XALATAN) 0.005 % ophthalmic solution Place 1 drop into both eyes at bedtime. 05/13/23   [provider]  losartan (COZAAR) 25 MG tablet Take 25 mg by mouth daily.    [provider]  lubiprostone (AMITIZA) 24 MCG capsule Take 1 capsule (24 mcg total) by mouth 2 (two) times daily with a meal. 12/14/12   Fields, Sandi L, MD  polyethylene glycol (MIRALAX) 17 g packet Take 17 g by mouth daily. 10/27/19   Burgess Amor, PA-C  predniSONE (DELTASONE) 10 MG tablet Take 2 tablets (20 mg total) by mouth 2 (two) times daily. 07/11/13   Mancel Bale, MD  TRAVATAN Z 0.004 % SOLN ophthalmic solution Place 1 drop into both eyes at bedtime.  11/17/12   [provider]      Allergies    Ace inhibitors and Pravastatin    Review of Systems   Review of Systems  Constitutional:  Negative for chills and fever.  HENT:  Negative for congestion and sore throat.   Eyes: Negative.   Respiratory:  Negative for chest tightness and shortness of breath.   Cardiovascular:  Negative for chest pain and palpitations.  Gastrointestinal:  Negative for abdominal pain, nausea and vomiting.  Genitourinary: Negative.   Musculoskeletal:  Negative for arthralgias, joint swelling and neck pain.  Skin: Negative.  Negative for rash and wound.  Neurological:  Positive for weakness and  light-headedness. Negative for dizziness, numbness and headaches.  Psychiatric/Behavioral: Negative.    All other systems reviewed and are negative.   Physical Exam Updated Vital Signs BP (!) 143/60   Pulse 67   Temp 98.3 F (36.8 C) (Oral)   Resp 17   Ht 5\' 8"  (1.727 m)   Wt 54.9 kg   SpO2 97%   BMI 18.40 kg/m  Physical Exam Vitals and nursing note reviewed.  Constitutional:      Appearance: She is well-developed.  HENT:     Head: Normocephalic and atraumatic.  Eyes:     Conjunctiva/sclera: Conjunctivae normal.  Cardiovascular:     Rate and Rhythm: Normal rate and regular rhythm.      Heart sounds: Murmur heard.     Systolic murmur is present.     Comments: Near holosystolic murmer.   Pulmonary:     Effort: Pulmonary effort is normal.     Breath sounds: Normal breath sounds. No wheezing.  Abdominal:     General: Bowel sounds are normal.     Palpations: Abdomen is soft.     Tenderness: There is no abdominal tenderness.  Musculoskeletal:        General: Normal range of motion.     Cervical back: Normal range of motion.     Right lower leg: No edema.     Left lower leg: No edema.  Skin:    General: Skin is warm and dry.  Neurological:     Mental Status: She is alert.     ED Results / Procedures / Treatments   Labs (all labs ordered are listed, but only abnormal results are displayed) Labs Reviewed  BASIC METABOLIC PANEL - Abnormal; Notable for the following components:      Result Value   Glucose, Bld 104 (*)    Creatinine, Ser 1.06 (*)    GFR, Estimated 50 (*)    All other components within normal limits  CBC - Abnormal; Notable for the following components:   Hemoglobin 10.8 (*)    HCT 34.6 (*)    MCH 25.6 (*)    All other components within normal limits  CBG MONITORING, ED - Abnormal; Notable for the following components:   Glucose-Capillary 114 (*)    All other components within normal limits  URINALYSIS, ROUTINE W REFLEX MICROSCOPIC    EKG EKG Interpretation Date/Time:  Friday May 14 2023 12:11:46 EST Ventricular Rate:  63 PR Interval:  183 QRS Duration:  124 QT Interval:  437 QTC Calculation: 448 R Axis:   -46  Text Interpretation: Sinus rhythm Left bundle branch block Confirmed by Estanislado Pandy [phone]) on 05/14/2023 1:48:05 PM  Radiology DG Chest Portable 1 View Result Date: 05/14/2023 CLINICAL DATA:  near syncope, murmur on exam.  Near-syncope. EXAM: PORTABLE CHEST 1 VIEW COMPARISON:  11/15/2009. FINDINGS: Bilateral lung fields are clear. Bilateral costophrenic angles are clear. Stable cardio-mediastinal silhouette. No acute  osseous abnormalities. The soft tissues are within normal limits. IMPRESSION: No active disease. Electronically Signed   By: Jules Schick M.D.   On: 05/14/2023 14:23    Procedures Procedures    Medications Ordered in ED Medications - No data to display  ED Course/ Medical Decision Making/ A&P                                 Medical Decision Making Patient presenting with near syncopal event with previous other similar events,  sometimes  at rest, other times like today when walking in a local store.  She has been told before she has a heart murmur, denies any formal workup of this, moderate murmur on exam today.  Ddx including decompensated aortic stenosis, dehydration,  arrhythmia, orthostasis.   Amount and/or Complexity of Data Reviewed Labs: ordered.    Details: Labs reviewed and negative Radiology: ordered.    Details: Chest x-ray reviewed, no active disease. ECG/medicine tests: ordered.    Details: Sinus rhythm with left bundle branch block, rate 63 Discussion of management or test interpretation with external provider(s): Patient discussed with Dr. Wyline Mood, who recommends overnight ops for echocardiogram, continued monitoring and would consider outpatient cardiac monitor.  Discussed with Dr. Laural Benes who accepts patient for admission.  Risk Decision regarding hospitalization.           Final Clinical Impression(s) / ED Diagnoses Final diagnoses:  Near syncope  Murmur, heart    Rx / DC Orders ED Discharge Orders     None         Victoriano Lain 05/14/23 1441    Coral Spikes, DO 05/14/23 1609

## 2023-05-15 ENCOUNTER — Observation Stay (HOSPITAL_BASED_OUTPATIENT_CLINIC_OR_DEPARTMENT_OTHER): Payer: Medicare HMO

## 2023-05-15 ENCOUNTER — Other Ambulatory Visit (HOSPITAL_COMMUNITY): Payer: Self-pay | Admitting: *Deleted

## 2023-05-15 DIAGNOSIS — R011 Cardiac murmur, unspecified: Secondary | ICD-10-CM

## 2023-05-15 DIAGNOSIS — I350 Nonrheumatic aortic (valve) stenosis: Secondary | ICD-10-CM

## 2023-05-15 DIAGNOSIS — I10 Essential (primary) hypertension: Secondary | ICD-10-CM | POA: Diagnosis not present

## 2023-05-15 DIAGNOSIS — R55 Syncope and collapse: Secondary | ICD-10-CM

## 2023-05-15 DIAGNOSIS — E782 Mixed hyperlipidemia: Secondary | ICD-10-CM | POA: Diagnosis not present

## 2023-05-15 LAB — ECHOCARDIOGRAM COMPLETE
AR max vel: 0.62 cm2
AV Area VTI: 0.55 cm2
AV Area mean vel: 0.57 cm2
AV Mean grad: 44 mm[Hg]
AV Peak grad: 71.6 mm[Hg]
Ao pk vel: 4.23 m/s
Area-P 1/2: 2.01 cm2
Height: 69 in
P 1/2 time: 514 ms
S' Lateral: 2.4 cm
Weight: 1834.23 [oz_av]

## 2023-05-15 LAB — GLUCOSE, CAPILLARY: Glucose-Capillary: 90 mg/dL (ref 70–99)

## 2023-05-15 NOTE — Progress Notes (Signed)
*  PRELIMINARY RESULTS* Echocardiogram 2D Echocardiogram has been performed.  Victoria Klein 05/15/2023, 11:18 AM

## 2023-05-15 NOTE — Care Management Obs Status (Signed)
MEDICARE OBSERVATION STATUS NOTIFICATION   Patient Details  Name: Victoria Klein MRN: [id number] Date of Birth: 06/16/1931   Medicare Observation Status Notification Given:  Yes    I Johny Shock., LCSWA verbally reviewed observation notice, signature provided by Jodelle Green.   Villa Herb, LCSWA 05/15/2023, 12:02 PM

## 2023-05-15 NOTE — TOC CM/SW Note (Signed)
Transition of Care [facility]) - Inpatient Brief Assessment   Patient Details  Name: Victoria Klein MRN: [id number] Date of Birth: 01-06-1932  Transition of Care [facility]) CM/SW Contact:    Villa Herb, LCSWA Phone Number: 05/15/2023, 12:04 PM   Clinical Narrative: CSW completed chart review. At this time there are no toc need present. Please place toc consult if needs arise.   Transition of Care Asessment: Insurance and Status: Insurance coverage has been reviewed Patient has primary care physician: Yes Home environment has been reviewed: frome home Prior level of function:: independent Prior/Current Home Services: No current home services Social Drivers of Health Review: SDOH reviewed no interventions necessary Readmission risk has been reviewed: Yes Transition of care needs: no transition of care needs at this time

## 2023-05-15 NOTE — Evaluation (Signed)
Physical Therapy Evaluation Patient Details Name: Victoria Klein MRN: [id number] DOB: January 10, 1932 Today's Date: 05/15/2023  History of Present Illness  Victoria Klein is a 89 year old female with hypertension, GERD, hyperlipidemia, history of right-sided Bell's palsy, glaucoma reportedly has been having intermittent episodes of near syncope associated with diaphoresis but no chest pain or shortness of breath.  Patient reportedly has lost 100 pounds in the last 2 years.  She was shopping in [place] today and had another episode of lightheadedness and diaphoresis and had to sit down.  She felt like she was going to pass out but she did not.  She had no chest pain or shortness of breath with the episode.  These episodes have been occurring for the last 6 months.  The episodes occur at rest and with ambulation.  She does not have dizziness with the episodes.  She does not have a change in vision with the episodes.  There is no nausea or vomiting or diarrhea.  In the ED she was evaluated and noted to have a holosystolic murmur.  Patient reports no history of this.  Patient is being admitted for further management and workup of the symptoms of near syncope and murmur.    Clinical Impression  Patient lying in bed on therapist arrival; pleasant and agreeable to therapist assessment.  Patient modified independent with bed mobility and sit to stand; taking slightly extra time.  Demonstrates good balance sitting on edge of the bed. Initially on standing needed CGA for safety but then only supervision and able to walk in the room without an AD; slight decreased gait speed noted and wide BOS; appears to be at her baseline of function.  Patient does not need any further skilled PT services at this time.  patient left in bed with call button in reach, bed alarm set and nursing notified of mobility status         If plan is discharge home, recommend the following: Help with stairs or ramp for entrance   Can travel by  private vehicle        Equipment Recommendations None recommended by PT  Recommendations for Other Services       Functional Status Assessment Patient has had a recent decline in their functional status and demonstrates the ability to make significant improvements in function in a reasonable and predictable amount of time.     Precautions / Restrictions Precautions Precautions: Fall Restrictions Weight Bearing Restrictions Per Provider Order: No      Mobility  Bed Mobility Overal bed mobility: Modified Independent             General bed mobility comments: takes slightly extra time    Transfers Overall transfer level: Needs assistance Equipment used: None Transfers: Sit to/from Stand Sit to Stand: Supervision, Contact guard assist                Ambulation/Gait Ambulation/Gait assistance: Supervision, Contact guard assist Gait Distance (Feet): 40 Feet Assistive device: None Gait Pattern/deviations: Wide base of support       General Gait Details: grossly wfl; slight decreased gait speed  Stairs            Wheelchair Mobility     Tilt Bed    Modified Rankin (Stroke Patients Only)       Balance Overall balance assessment: Needs assistance Sitting-balance support: No upper extremity supported, Feet supported Sitting balance-Leahy Scale: Good Sitting balance - Comments: good sitting balance on EOb     Standing balance-Leahy Scale: Good  Standing balance comment: good standing balance no AD; PT initial CGA for safety but then only needs supervision                             Pertinent Vitals/Pain Pain Assessment Pain Assessment: No/denies pain    Home Living Family/patient expects to be discharged to:: Private residence Living Arrangements: Children Available Help at Discharge: Family;Available 24 hours/day Type of Home: Mobile home Home Access: Stairs to enter Entrance Stairs-Rails: Left;Right;Can reach both Entrance  Stairs-Number of Steps: 3   Home Layout: One level Home Equipment: Cane - single point Additional Comments: discussed with her getting a shower chair    Prior Function Prior Level of Function : Independent/Modified Independent             Mobility Comments: her daughter takes her to get groceries every Friday; patient still cooks and cleans in the home       Extremity/Trunk Assessment   Upper Extremity Assessment Upper Extremity Assessment: Right hand dominant    Lower Extremity Assessment Lower Extremity Assessment: Generalized weakness    Cervical / Trunk Assessment Cervical / Trunk Assessment: Kyphotic  Communication   Communication Communication: No apparent difficulties  Cognition Arousal: Alert Behavior During Therapy: WFL for tasks assessed/performed Overall Cognitive Status: Within Functional Limits for tasks assessed                                 General Comments: pleasant and cooperative        General Comments      Exercises     Assessment/Plan    PT Assessment Patient does not need any further PT services  PT Problem List         PT Treatment Interventions      PT Goals (Current goals can be found in the Care Plan section)  Acute Rehab PT Goals Patient Stated Goal: return home PT Goal Formulation: With patient Time For Goal Achievement: 05/29/23 Potential to Achieve Goals: Good    Frequency       Co-evaluation               AM-PAC PT "6 Clicks" Mobility  Outcome Measure Help needed turning from your back to your side while in a flat bed without using bedrails?: None Help needed moving from lying on your back to sitting on the side of a flat bed without using bedrails?: None Help needed moving to and from a bed to a chair (including a wheelchair)?: None Help needed standing up from a chair using your arms (e.g., wheelchair or bedside chair)?: None Help needed to walk in hospital room?: A Little Help needed  climbing 3-5 steps with a railing? : A Little 6 Click Score: 22    End of Session   Activity Tolerance: Patient tolerated treatment well Patient left: in bed;with call bell/phone within reach;with bed alarm set Nurse Communication: Mobility status PT Visit Diagnosis: Muscle weakness (generalized) (M62.81);Unsteadiness on feet (R26.81)    Time: 1610-9604 PT Time Calculation (min) (ACUTE ONLY): 25 min   Charges:   PT Evaluation $PT Eval Low Complexity: 1 Low   PT General Charges $$ ACUTE PT VISIT: 1 Visit        9:26 AM, 05/15/23 Ivette Castronova Small Makinzi Prieur MPT [facility] [place] [phone] Ph:[phone]

## 2023-05-15 NOTE — Discharge Instructions (Signed)
IMPORTANT INFORMATION: PAY CLOSE ATTENTION   PHYSICIAN DISCHARGE INSTRUCTIONS  Follow with Primary care provider  [facility]  and other consultants as instructed by your Hospitalist Physician  [facility] IF SYMPTOMS COME BACK, WORSEN OR NEW PROBLEM DEVELOPS   Please note: You were cared for by a hospitalist during your hospital stay. Every effort will be made to forward records to your primary care provider.  You can request that your primary care provider send for your hospital records if they have not received them.  Once you are discharged, your primary care physician will handle any further medical issues. Please note that NO REFILLS for any discharge medications will be authorized once you are discharged, as it is imperative that you return to your primary care physician (or establish a relationship with a primary care physician if you do not have one) for your post hospital discharge needs so that they can reassess your need for medications and monitor your lab values.  Please get a complete blood count and chemistry panel checked by your Primary MD at your next visit, and again as instructed by your Primary MD.  Get Medicines reviewed and adjusted: Please take all your medications with you for your next visit with your Primary MD  Laboratory/radiological data: Please request your Primary MD to go over all hospital tests and procedure/radiological results at the follow up, please ask your primary care provider to get all Hospital records sent to his/her office.  In some cases, they will be blood work, cultures and biopsy results pending at the time of your discharge. Please request that your primary care provider follow up on these results.  If you are diabetic, please bring your blood sugar readings with you to your follow up appointment with primary care.    Please call and make your follow up appointments as soon as  possible.    Also Note the following: If you experience worsening of your admission symptoms, develop shortness of breath, life threatening emergency, suicidal or homicidal thoughts you must seek medical attention immediately by calling 911 or calling your MD immediately  if symptoms less severe.  You must read complete instructions/literature along with all the possible adverse reactions/side effects for all the Medicines you take and that have been prescribed to you. Take any new Medicines after you have completely understood and accpet all the possible adverse reactions/side effects.   Do not drive when taking Pain medications or sleeping medications (Benzodiazepines)  Do not take more than prescribed Pain, Sleep and Anxiety Medications. It is not advisable to combine anxiety,sleep and pain medications without talking with your primary care practitioner  Special Instructions: If you have smoked or chewed Tobacco  in the last 2 yrs please stop smoking, stop any regular Alcohol  and or any Recreational drug use.  Wear Seat belts while driving.  Do not drive if taking any narcotic, mind altering or controlled substances or recreational drugs or alcohol.

## 2023-05-15 NOTE — Discharge Summary (Signed)
Physician Discharge Summary  Victoria Klein OZH:[id number] DOB: 03/05/1932 DOA: 05/14/2023  PCP: [facility]  Admit date: 05/14/2023 Discharge date: 05/15/2023  Admitted From:  Home  Disposition: Home   Recommendations for Outpatient Follow-up:  Follow up with PCP in 1 weeks Follow up with cardiology in 2 weeks   Home Health: n/a   Discharge Condition: STABLE   CODE STATUS: FULL  DIET: heart healthy    Brief Hospitalization Summary: Please see all hospital notes, images, labs for full details of the hospitalization. 89 year old female with hypertension, GERD, hyperlipidemia, history of right-sided Bell's palsy, glaucoma reportedly has been having intermittent episodes of near syncope associated with diaphoresis but no chest pain or shortness of breath.  Patient reportedly has lost 100 pounds in the last 2 years.  She was shopping in [place] today and had another episode of lightheadedness and diaphoresis and had to sit down.  She felt like she was going to pass out but she did not.  She had no chest pain or shortness of breath with the episode.  These episodes have been occurring for the last 6 months.  The episodes occur at rest and with ambulation.  She does not have dizziness with the episodes.  She does not have a change in vision with the episodes.  There is no nausea or vomiting or diarrhea.  In the ED she was evaluated and noted to have a holosystolic murmur.  Patient reports no history of this.  Patient is being admitted for further management and workup of the symptoms of near syncope and murmur.   Hospital course by problem list  Near syncope - recurrent episodes per patient - gently hydrated with IV fluids - carotid US studies reassuring no hemodynamically significant stenosis - TTE confirms severe aortic stenosis  - requested a 30 day cardiac monitor be mailed to home - ambulatory referral to cardiology to discuss severe AS   Essential hypertension   - BP has been well managed and controlled    Hyperlipidemia - resume home medications   LBBB - no CP symptoms - will have her follow up with cardiology at [facility] - monitored on telemetry while in hospital  - arranging for 30 day cardiac monitor, sent request to [facility] [place] office   GERD - famotidine daily for GI protection    Heart Murmur/Severe Aortic Stenosis  - harsh holosystolic murmur  - TTE study positive for severe AS - follow up with cardiology to discuss further - outpatient follow up due to no cardiology service at [facility] on weekends  Discharge Diagnoses:  Principal Problem:   Near syncope Active Problems:   Constipation   GERD (gastroesophageal reflux disease)   Heart murmur   LBBB (left bundle branch block)   Essential hypertension   Glaucoma   Hyperlipidemia   Discharge Instructions: Discharge Instructions     Ambulatory referral to Cardiology   Complete by: As directed       Allergies as of 05/15/2023       Reactions   Ace Inhibitors Other (See Comments)   Arm heaviness   Pravastatin Other (See Comments)   Muscle Pain        Medication List     TAKE these medications    ezetimibe 10 MG tablet Commonly known as: ZETIA Take 10 mg by mouth daily.   hydrochlorothiazide 25 MG tablet Commonly known as: HYDRODIURIL Take 25 mg by mouth daily.   latanoprost 0.005 % ophthalmic solution Commonly known as: Harrel Lemon  Place 1 drop into both eyes at bedtime.   losartan 25 MG tablet Commonly known as: COZAAR Take 25 mg by mouth daily.        Follow-up Information     [facility] Schedule an appointment as soon as possible for a visit in 1 week(s).   Why: Hospital Follow Up Contact information: [address] [place] [address] [phone]         [facility] Schedule an appointment as soon as possible for a visit in 2 week(s).   Specialty: Cardiology Contact information: [address] [place] [address] [phone]               Allergies  Allergen Reactions   Ace Inhibitors Other (See Comments)    Arm heaviness   Pravastatin Other (See Comments)    Muscle Pain   Allergies as of 05/15/2023       Reactions   Ace Inhibitors Other (See Comments)   Arm heaviness   Pravastatin Other (See Comments)   Muscle Pain        Medication List     TAKE these medications    ezetimibe 10 MG tablet Commonly known as: ZETIA Take 10 mg by mouth daily.   hydrochlorothiazide 25 MG tablet Commonly known as: HYDRODIURIL Take 25 mg by mouth daily.   latanoprost 0.005 % ophthalmic solution Commonly known as: XALATAN Place 1 drop into both eyes at bedtime.   losartan 25 MG tablet Commonly known as: COZAAR Take 25 mg by mouth daily.        Procedures/Studies: ECHOCARDIOGRAM COMPLETE Result Date: 05/15/2023    ECHOCARDIOGRAM REPORT   Patient Name:   Victoria Klein Date of Exam: 05/15/2023 Medical Rec #:  [id number]      Height:       69.0 in Accession #:    [id number]     Weight:       114.6 lb Date of Birth:  1931-07-10       BSA:          1.631 m Patient Age:    89 years       BP:           138/58 mmHg Patient Gender: F              HR:           71 bpm. [facility]am [facility] Procedure: 2D Echo, Cardiac Doppler and Color Doppler Indications:    Syncope R55  History:        Patient has no prior history of Echocardiogram examinations.                 Arrythmias:LBBB, Signs/Symptoms:Murmur; Risk                 Factors:Hypertension and Dyslipidemia.  Sonographer:    Celesta Gentile RCS Referring Phys: [phone] Yara Tomkinson L Kaleea Penner IMPRESSIONS  1. Left ventricular ejection fraction, by estimation, is 60 to 65%. The left ventricle has normal function. The left ventricle has no regional wall motion abnormalities. There is severe concentric left ventricular hypertrophy. Left ventricular diastolic  parameters are consistent with Grade I diastolic dysfunction  (impaired relaxation).  2. Right ventricular systolic function is normal. The right ventricular size is normal.  3. Left atrial size was mildly dilated.  4. The mitral valve is normal in structure. Trivial mitral valve regurgitation. No evidence of mitral stenosis.  5. The  aortic valve is tricuspid. Aortic valve regurgitation is mild. Severe aortic valve stenosis.  6. The inferior vena cava is normal in size with greater than 50% respiratory variability, suggesting right atrial pressure of 3 mmHg. Comparison(s): No prior Echocardiogram. FINDINGS  Left Ventricle: Left ventricular ejection fraction, by estimation, is 60 to 65%. The left ventricle has normal function. The left ventricle has no regional wall motion abnormalities. The left ventricular internal cavity size was normal in size. There is  severe concentric left ventricular hypertrophy. Left ventricular diastolic parameters are consistent with Grade I diastolic dysfunction (impaired relaxation). Right Ventricle: The right ventricular size is normal. Right ventricular systolic function is normal. Left Atrium: Left atrial size was mildly dilated. Right Atrium: Right atrial size was normal in size. Pericardium: Trivial pericardial effusion is present. Mitral Valve: The mitral valve is normal in structure. Mild mitral annular calcification. Trivial mitral valve regurgitation. No evidence of mitral valve stenosis. Tricuspid Valve: The tricuspid valve is normal in structure. Tricuspid valve regurgitation is mild . No evidence of tricuspid stenosis. Aortic Valve: The aortic valve is tricuspid. Aortic valve regurgitation is mild. Aortic regurgitation PHT measures 514 msec. Severe aortic stenosis is present. Aortic valve mean gradient measures 44.0 mmHg. Aortic valve peak gradient measures 71.6 mmHg. Aortic valve area, by VTI measures 0.55 cm. Pulmonic Valve: The pulmonic valve was grossly normal. Pulmonic valve regurgitation is not visualized. No evidence of  pulmonic stenosis. Aorta: The aortic root is normal in size and structure. Venous: The inferior vena cava is normal in size with greater than 50% respiratory variability, suggesting right atrial pressure of 3 mmHg. IAS/Shunts: No atrial level shunt detected by color flow Doppler.  LEFT VENTRICLE PLAX 2D LVIDd:         4.30 cm   Diastology LVIDs:         2.40 cm   LV e' medial:    3.37 cm/s LV PW:         1.40 cm   LV E/e' medial:  27.6 LV IVS:        1.05 cm   LV e' lateral:   4.46 cm/s LVOT diam:     2.10 cm   LV E/e' lateral: 20.9 LV SV:         60 LV SV Index:   37 LVOT Area:     3.46 cm  RIGHT VENTRICLE RV S prime:     9.36 cm/s TAPSE (M-mode): 2.1 cm LEFT ATRIUM             Index        RIGHT ATRIUM           Index LA diam:        3.80 cm 2.33 cm/m   RA Area:     19.80 cm LA Vol (A2C):   64.6 ml 39.61 ml/m  RA Volume:   65.90 ml  40.41 ml/m LA Vol (A4C):   58.2 ml 35.69 ml/m LA Biplane Vol: 62.5 ml 38.32 ml/m  AORTIC VALVE AV Area (Vmax):    0.62 cm AV Area (Vmean):   0.57 cm AV Area (VTI):     0.55 cm AV Vmax:           423.00 cm/s AV Vmean:          313.000 cm/s AV VTI:            1.090 m AV Peak Grad:      71.6 mmHg AV Mean Grad:  44.0 mmHg LVOT Vmax:         76.10 cm/s LVOT Vmean:        51.900 cm/s LVOT VTI:          0.173 m LVOT/AV VTI ratio: 0.16 AI PHT:            514 msec  AORTA Ao Root diam: 3.10 cm MITRAL VALVE                TRICUSPID VALVE MV Area (PHT): 2.01 cm     TR Peak grad:   22.8 mmHg MV Decel Time: 377 msec     TR Vmax:        239.00 cm/s MV E velocity: 93.10 cm/s MV A velocity: 121.00 cm/s  SHUNTS MV E/A ratio:  0.77         Systemic VTI:  0.17 m                             Systemic Diam: 2.10 cm Olga Millers MD Electronically signed by Olga Millers MD Signature Date/Time: 05/15/2023/1:21:40 PM    Final    US Carotid Bilateral Result Date: 05/15/2023 CLINICAL DATA:  Near syncope EXAM: BILATERAL CAROTID DUPLEX ULTRASOUND TECHNIQUE: Wallace Cullens scale imaging, color Doppler and  duplex ultrasound were performed of bilateral carotid and vertebral arteries in the neck. COMPARISON:  None Available. FINDINGS: Criteria: Quantification of carotid stenosis is based on velocity parameters that correlate the residual internal carotid diameter with NASCET-based stenosis levels, using the diameter of the distal internal carotid lumen as the denominator for stenosis measurement. The following velocity measurements were obtained: RIGHT ICA: 119/26 cm/sec CCA: 114/19 cm/sec SYSTOLIC ICA/CCA RATIO:  1.6 ECA:  155 cm/sec LEFT ICA: 100/22 cm/sec CCA: 99/12 cm/sec SYSTOLIC ICA/CCA RATIO:  1.9 ECA:  100 cm/sec RIGHT CAROTID ARTERY: Heterogeneous atherosclerotic plaque with irregular ulcerated surface throughout the common carotid artery. Plaque extends into the carotid bulb. By peak systolic velocity criteria the estimated stenosis is less than 50%. RIGHT VERTEBRAL ARTERY:  Patent with normal antegrade flow. LEFT CAROTID ARTERY: Irregular ulcerated heterogeneous atherosclerotic plaque throughout the common carotid artery extending into the proximal internal carotid artery. By peak systolic velocity criteria, the estimated stenosis is less than 50%. LEFT VERTEBRAL ARTERY:  Patent with normal antegrade flow. IMPRESSION: 1. Mild (1-49%) stenosis proximal right internal carotid artery secondary to heterogeneous and irregular/ulcerated atherosclerotic plaque. 2. Mild (1-49%) stenosis proximal left internal carotid artery secondary to heterogeneous and irregular/ulcerated atherosclerotic plaque. 3. Vertebral arteries are patent with normal antegrade flow. Electronically Signed   By: Malachy Moan M.D.   On: 05/15/2023 07:18   DG Chest Portable 1 View Result Date: 05/14/2023 CLINICAL DATA:  near syncope, murmur on exam.  Near-syncope. EXAM: PORTABLE CHEST 1 VIEW COMPARISON:  11/15/2009. FINDINGS: Bilateral lung fields are clear. Bilateral costophrenic angles are clear. Stable cardio-mediastinal silhouette. No  acute osseous abnormalities. The soft tissues are within normal limits. IMPRESSION: No active disease. Electronically Signed   By: Jules Schick M.D.   On: 05/14/2023 14:23     Subjective: Pt reports she feels well, no recurrence of symptoms.  Ambulated well in room and hall with PT.   Discharge Exam: Vitals:   05/15/23 0146 05/15/23 0544  BP: (!) 116/53 (!) 138/58  Pulse: 66 71  Resp: 16 18  Temp: 98.6 F (37 C) 97.9 F (36.6 C)  SpO2: 100% 94%   Vitals:   05/14/23 2342 05/15/23 0146 05/15/23  0223 05/15/23 0544  BP: (!) 111/58 (!) 116/53  (!) 138/58  Pulse: 67 66  71  Resp:  16  18  Temp: 98.4 F (36.9 C) 98.6 F (37 C)  97.9 F (36.6 C)  TempSrc: Oral Oral  Oral  SpO2: 98% 100%  94%  Weight:   52 kg   Height:       General: Pt is alert, awake, not in acute distress Cardiovascular: normal S1/S2 + with harsh systolic murmur; no rubs, no gallops Respiratory: CTA bilaterally, no wheezing, no rhonchi Abdominal: Soft, NT, ND, bowel sounds + Extremities: no edema, no cyanosis   The results of significant diagnostics from this hospitalization (including imaging, microbiology, ancillary and laboratory) are listed below for reference.    Microbiology: No results found for this or any previous visit (from the past 240 hours).   Labs: BNP (last 3 results) No results for input(s): "BNP" in the last 8760 hours. Basic Metabolic Panel: Recent Labs  Lab 05/14/23 1219  NA 137  K 3.6  CL 102  CO2 28  GLUCOSE 104*  BUN 20  CREATININE 1.06*  CALCIUM 9.1   Liver Function Tests: No results for input(s): "AST", "ALT", "ALKPHOS", "BILITOT", "PROT", "ALBUMIN" in the last 168 hours. No results for input(s): "LIPASE", "AMYLASE" in the last 168 hours. No results for input(s): "AMMONIA" in the last 168 hours. CBC: Recent Labs  Lab 05/14/23 1219  WBC 9.8  HGB 10.8*  HCT 34.6*  MCV 82.0  PLT 368   Cardiac Enzymes: No results for input(s): "CKTOTAL", "CKMB", "CKMBINDEX",  "TROPONINI" in the last 168 hours. BNP: Invalid input(s): "POCBNP" CBG: Recent Labs  Lab 05/14/23 1116 05/15/23 0421  GLUCAP 114* 90   D-Dimer No results for input(s): "DDIMER" in the last 72 hours. Hgb A1c No results for input(s): "HGBA1C" in the last 72 hours. Lipid Profile No results for input(s): "CHOL", "HDL", "LDLCALC", "TRIG", "CHOLHDL", "LDLDIRECT" in the last 72 hours. Thyroid function studies No results for input(s): "TSH", "T4TOTAL", "T3FREE", "THYROIDAB" in the last 72 hours.  Invalid input(s): "FREET3" Anemia work up No results for input(s): "VITAMINB12", "FOLATE", "FERRITIN", "TIBC", "IRON", "RETICCTPCT" in the last 72 hours. Urinalysis    Component Value Date/Time   COLORURINE YELLOW 11/15/2009 1750   APPEARANCEUR CLEAR 11/15/2009 1750   LABSPEC 1.025 11/15/2009 1750   PHURINE 6.0 11/15/2009 1750   GLUCOSEU NEGATIVE 11/15/2009 1750   HGBUR NEGATIVE 11/15/2009 1750   BILIRUBINUR NEGATIVE 11/15/2009 1750   KETONESUR NEGATIVE 11/15/2009 1750   PROTEINUR NEGATIVE 11/15/2009 1750   UROBILINOGEN 0.2 11/15/2009 1750   NITRITE NEGATIVE 11/15/2009 1750   LEUKOCYTESUR  11/15/2009 1750    NEGATIVE MICROSCOPIC NOT DONE ON URINES WITH NEGATIVE PROTEIN, BLOOD, LEUKOCYTES, NITRITE, OR GLUCOSE <1000 mg/dL.   Sepsis Labs Recent Labs  Lab 05/14/23 1219  WBC 9.8   Microbiology No results found for this or any previous visit (from the past 240 hours).  Time coordinating discharge: 36 mins  SIGNED:  Standley Dakins, MD  [facility] 05/15/2023, 2:14 PM How to contact the [facility] Attending or Consulting provider 7A - 7P or covering provider during after hours 7P -7A, for this patient?  Check the care team in [facility] and look for a) attending/consulting [facility] provider listed and b) the [facility] team listed Log into [email or web address] and use [place]'s universal password to access. If you do not have the password, please contact the hospital operator. Locate the [facility] provider  you are looking for under [facility] and page  to a number that you can be directly reached. If you still have difficulty reaching the provider, please page the [facility] (Director on Call) for the Hospitalists listed on amion for assistance.

## 2023-05-15 NOTE — Progress Notes (Signed)
Patient discharged home. All questions and concerns answered. Patient has all personal belongings including discharge paperwork. Patient left floor by wheelchair to POV.

## 2023-05-17 ENCOUNTER — Telehealth: Payer: Self-pay | Admitting: *Deleted

## 2023-05-17 DIAGNOSIS — R55 Syncope and collapse: Secondary | ICD-10-CM

## 2023-05-17 NOTE — Telephone Encounter (Signed)
Order placed and pt enrolled in [employer].

## 2023-05-17 NOTE — Telephone Encounter (Signed)
-----   Message from [facility] sent at 05/15/2023  1:14 PM EST ----- Regarding: 30 day cardiac monitor Dear Jake Seats,  Please arrange for this patient to have a 30 day cardiac monitor. She is being evaluated for recurrent episodes of syncope.  She is discharging from hospital today and it will be fine to mail it to her home.  Thank you very much.    Maryln Manuel MD  [facility]

## 2023-05-25 DIAGNOSIS — R55 Syncope and collapse: Secondary | ICD-10-CM

## 2023-05-26 ENCOUNTER — Ambulatory Visit: Payer: Medicare HMO

## 2023-05-26 ENCOUNTER — Ambulatory Visit: Payer: Medicare HMO | Attending: Internal Medicine

## 2023-08-16 ENCOUNTER — Ambulatory Visit: Payer: Medicare HMO | Attending: Internal Medicine | Admitting: Internal Medicine

## 2023-08-16 NOTE — Progress Notes (Signed)
 Erroneous encounter - please disregard.

## 2024-02-07 ENCOUNTER — Encounter (HOSPITAL_COMMUNITY): Payer: Self-pay | Admitting: Emergency Medicine

## 2024-02-07 ENCOUNTER — Encounter (HOSPITAL_COMMUNITY): Payer: Self-pay

## 2024-02-07 ENCOUNTER — Emergency Department (HOSPITAL_COMMUNITY)
Admission: EM | Admit: 2024-02-07 | Discharge: 2024-02-07 | Disposition: A | Discharge status: Home / Self Care | Source: Home / Self Care | Attending: Emergency Medicine | Admitting: Emergency Medicine

## 2024-02-07 ENCOUNTER — Emergency Department (HOSPITAL_COMMUNITY)

## 2024-02-07 ENCOUNTER — Inpatient Hospital Stay (HOSPITAL_COMMUNITY)
Admission: EM | Admit: 2024-02-07 | Discharge: 2024-02-10 | DRG: 394 | Disposition: A | Discharge status: Home / Self Care | Attending: Hospitalist | Admitting: Hospitalist

## 2024-02-07 ENCOUNTER — Other Ambulatory Visit: Payer: Self-pay

## 2024-02-07 DIAGNOSIS — E876 Hypokalemia: Secondary | ICD-10-CM | POA: Diagnosis present

## 2024-02-07 DIAGNOSIS — I10 Essential (primary) hypertension: Secondary | ICD-10-CM | POA: Diagnosis present

## 2024-02-07 DIAGNOSIS — Z681 Body mass index (BMI) 19 or less, adult: Secondary | ICD-10-CM | POA: Diagnosis not present

## 2024-02-07 DIAGNOSIS — E785 Hyperlipidemia, unspecified: Secondary | ICD-10-CM | POA: Diagnosis present

## 2024-02-07 DIAGNOSIS — I350 Nonrheumatic aortic (valve) stenosis: Secondary | ICD-10-CM | POA: Diagnosis present

## 2024-02-07 DIAGNOSIS — Z888 Allergy status to other drugs, medicaments and biological substances status: Secondary | ICD-10-CM | POA: Diagnosis not present

## 2024-02-07 DIAGNOSIS — K5909 Other constipation: Secondary | ICD-10-CM | POA: Diagnosis not present

## 2024-02-07 DIAGNOSIS — K5641 Fecal impaction: Secondary | ICD-10-CM | POA: Diagnosis present

## 2024-02-07 DIAGNOSIS — K5900 Constipation, unspecified: Principal | ICD-10-CM | POA: Diagnosis present

## 2024-02-07 DIAGNOSIS — Z79899 Other long term (current) drug therapy: Secondary | ICD-10-CM | POA: Diagnosis not present

## 2024-02-07 DIAGNOSIS — K5289 Other specified noninfective gastroenteritis and colitis: Principal | ICD-10-CM | POA: Diagnosis present

## 2024-02-07 DIAGNOSIS — N179 Acute kidney failure, unspecified: Secondary | ICD-10-CM | POA: Diagnosis present

## 2024-02-07 DIAGNOSIS — K6289 Other specified diseases of anus and rectum: Principal | ICD-10-CM | POA: Diagnosis present

## 2024-02-07 DIAGNOSIS — R5381 Other malaise: Secondary | ICD-10-CM | POA: Diagnosis present

## 2024-02-07 DIAGNOSIS — R338 Other retention of urine: Secondary | ICD-10-CM | POA: Diagnosis not present

## 2024-02-07 DIAGNOSIS — E440 Moderate protein-calorie malnutrition: Secondary | ICD-10-CM | POA: Diagnosis present

## 2024-02-07 DIAGNOSIS — N1831 Chronic kidney disease, stage 3a: Secondary | ICD-10-CM | POA: Diagnosis present

## 2024-02-07 DIAGNOSIS — R54 Age-related physical debility: Secondary | ICD-10-CM

## 2024-02-07 DIAGNOSIS — K668 Other specified disorders of peritoneum: Secondary | ICD-10-CM | POA: Diagnosis present

## 2024-02-07 DIAGNOSIS — R339 Retention of urine, unspecified: Secondary | ICD-10-CM | POA: Diagnosis present

## 2024-02-07 DIAGNOSIS — I129 Hypertensive chronic kidney disease with stage 1 through stage 4 chronic kidney disease, or unspecified chronic kidney disease: Secondary | ICD-10-CM | POA: Diagnosis present

## 2024-02-07 DIAGNOSIS — F17210 Nicotine dependence, cigarettes, uncomplicated: Secondary | ICD-10-CM | POA: Diagnosis present

## 2024-02-07 LAB — CBC WITH DIFFERENTIAL/PLATELET
Abs Immature Granulocytes: 0.02 K/uL (ref 0.00–0.07)
Basophils Absolute: 0 K/uL (ref 0.0–0.1)
Basophils Relative: 0 %
Eosinophils Absolute: 0.1 K/uL (ref 0.0–0.5)
Eosinophils Relative: 1 %
HCT: 34.3 % — ABNORMAL LOW (ref 36.0–46.0)
Hemoglobin: 11.2 g/dL — ABNORMAL LOW (ref 12.0–15.0)
Immature Granulocytes: 0 %
Lymphocytes Relative: 15 %
Lymphs Abs: 1.5 K/uL (ref 0.7–4.0)
MCH: 26.1 pg (ref 26.0–34.0)
MCHC: 32.7 g/dL (ref 30.0–36.0)
MCV: 80 fL (ref 80.0–100.0)
Monocytes Absolute: 0.8 K/uL (ref 0.1–1.0)
Monocytes Relative: 8 %
Neutro Abs: 7.7 K/uL (ref 1.7–7.7)
Neutrophils Relative %: 76 %
Platelets: 316 K/uL (ref 150–400)
RBC: 4.29 MIL/uL (ref 3.87–5.11)
RDW: 14.6 % (ref 11.5–15.5)
WBC: 10.1 K/uL (ref 4.0–10.5)
nRBC: 0 % (ref 0.0–0.2)

## 2024-02-07 LAB — URINALYSIS, ROUTINE W REFLEX MICROSCOPIC
Bilirubin Urine: NEGATIVE
Glucose, UA: NEGATIVE mg/dL
Hgb urine dipstick: NEGATIVE
Ketones, ur: NEGATIVE mg/dL
Leukocytes,Ua: NEGATIVE
Nitrite: NEGATIVE
Protein, ur: NEGATIVE mg/dL
Specific Gravity, Urine: 1.01 (ref 1.005–1.030)
pH: 5 (ref 5.0–8.0)

## 2024-02-07 LAB — URINALYSIS, W/ REFLEX TO CULTURE (INFECTION SUSPECTED)
Bacteria, UA: NONE SEEN
Bilirubin Urine: NEGATIVE
Glucose, UA: NEGATIVE mg/dL
Hgb urine dipstick: NEGATIVE
Ketones, ur: 5 mg/dL — AB
Leukocytes,Ua: NEGATIVE
Nitrite: NEGATIVE
Protein, ur: NEGATIVE mg/dL
Specific Gravity, Urine: 1.02 (ref 1.005–1.030)
pH: 5 (ref 5.0–8.0)

## 2024-02-07 LAB — COMPREHENSIVE METABOLIC PANEL WITH GFR
ALT: 11 U/L (ref 0–44)
AST: 29 U/L (ref 15–41)
Albumin: 4.3 g/dL (ref 3.5–5.0)
Alkaline Phosphatase: 76 U/L (ref 38–126)
Anion gap: 17 — ABNORMAL HIGH (ref 5–15)
BUN: 14 mg/dL (ref 8–23)
CO2: 25 mmol/L (ref 22–32)
Calcium: 9.5 mg/dL (ref 8.9–10.3)
Chloride: 97 mmol/L — ABNORMAL LOW (ref 98–111)
Creatinine, Ser: 1.17 mg/dL — ABNORMAL HIGH (ref 0.44–1.00)
GFR, Estimated: 44 mL/min — ABNORMAL LOW (ref >60)
Glucose, Bld: 113 mg/dL — ABNORMAL HIGH (ref 70–99)
Potassium: 3.1 mmol/L — ABNORMAL LOW (ref 3.5–5.1)
Sodium: 139 mmol/L (ref 135–145)
Total Bilirubin: 0.6 mg/dL (ref 0.0–1.2)
Total Protein: 7.5 g/dL (ref 6.5–8.1)

## 2024-02-07 LAB — LIPASE, BLOOD: Lipase: 17 U/L (ref 11–51)

## 2024-02-07 LAB — MAGNESIUM: Magnesium: 2 mg/dL (ref 1.7–2.4)

## 2024-02-07 MED ORDER — METRONIDAZOLE 500 MG/100ML IV SOLN
500.0000 mg | Freq: Once | INTRAVENOUS | Status: AC
  Administered 2024-02-07: 500 mg via INTRAVENOUS
  Filled 2024-02-07: qty 100

## 2024-02-07 MED ORDER — CEFTRIAXONE SODIUM 1 G IJ SOLR
1.0000 g | Freq: Once | INTRAMUSCULAR | Status: AC
  Administered 2024-02-07: 1 g via INTRAVENOUS
  Filled 2024-02-07: qty 10

## 2024-02-07 MED ORDER — SMOG ENEMA
960.0000 mL | Freq: Once | RECTAL | Status: AC
  Administered 2024-02-07: 960 mL via RECTAL
  Filled 2024-02-07: qty 960

## 2024-02-07 MED ORDER — IOHEXOL 300 MG/ML  SOLN: 80.0000 mL | Freq: Once | INTRAMUSCULAR | Status: DC | PRN

## 2024-02-07 MED ORDER — LACTATED RINGERS IV BOLUS
500.0000 mL | Freq: Once | INTRAVENOUS | Status: AC
  Administered 2024-02-07: 500 mL via INTRAVENOUS

## 2024-02-07 MED ORDER — MAGNESIUM CITRATE PO SOLN
1.0000 | Freq: Once | ORAL | Status: AC
Start: 2024-02-07 — End: 2024-02-07
  Administered 2024-02-07: 1 via ORAL
  Filled 2024-02-07: qty 296

## 2024-02-07 MED ORDER — POTASSIUM CHLORIDE 20 MEQ PO PACK
40.0000 meq | PACK | Freq: Once | ORAL | Status: AC
  Administered 2024-02-07: 40 meq via ORAL
  Filled 2024-02-07: qty 2

## 2024-02-07 NOTE — ED Triage Notes (Signed)
 Complaining of not having a bowel movement in the last 2 weeks. Her abdomen hurts in the lower quadrants. Tried an enema this morning with no results. She is also complaining of burning with urination.

## 2024-02-07 NOTE — ED Notes (Signed)
 Took patient off of bed pan. Patient advised she feels better. Patient had only liquid bowel.

## 2024-02-07 NOTE — ED Provider Notes (Addendum)
 [place] EM[facility]GENCY DEPARTMENT AT [facility] Provider Note   CSN: [id number] Arrival date & time: 02/07/24  9953     Patient presents with: Abdominal Pain   Victoria Klein is a 89 y.o. female.   The history is provided by the patient.  Abdominal Pain  She has history of hypertension, hyperlipidemia, GERD, diverticulosis, chronic constipation and comes in stating that she has not had a bowel movement in the last 2 weeks, and also states she has not been able to urinate since this morning.  She urinated twice this morning and has a sense that she has to urinate but when she sits on the commode she is not able to pass any urine.  She denies any abdominal pain, nausea, vomiting.    Prior to Admission medications   Medication Sig Start Date End Date Taking? Authorizing Provider  ezetimibe (ZETIA) 10 MG tablet Take 10 mg by mouth daily. 05/13/23   [provider]  hydrochlorothiazide (HYDRODIURIL) 25 MG tablet Take 25 mg by mouth daily. 05/13/23   [provider]  latanoprost (XALATAN) 0.005 % ophthalmic solution Place 1 drop into both eyes at bedtime. 05/13/23   [provider]  losartan (COZAAR) 25 MG tablet Take 25 mg by mouth daily.    [provider]    Allergies: Ace inhibitors and Pravastatin    Review of Systems  Gastrointestinal:  Positive for abdominal pain.  All other systems reviewed and are negative.   Updated Vital Signs BP (!) 189/81 (BP Location: Right Arm)   Pulse 79   Temp 99.1 F (37.3 C) (Oral)   Resp 20   Ht 5' 9 (1.753 m)   Wt 52.2 kg   SpO2 98%   BMI 16.98 kg/m   Physical Exam Vitals and nursing note reviewed. Exam conducted with a chaperone present.   89 year old female, resting comfortably and in no acute distress. Vital signs are significant for elevated blood pressure. Oxygen saturation is 98%, which is normal. Head is normocephalic and atraumatic. PERRLA, EOMI. Lungs are clear without rales,  wheezes, or rhonchi. Heart has regular rate and rhythm with harsh 3/6 ejection murmur heard diffusely throughout the precordium. Abdomen is soft, flat, with distended bladder noted. Rectal: Decreased finger tone.  Soft impaction present with stool light brown in color. Extremities have no cyanosis or edema, full range of motion is present. Skin is warm and dry without rash. Neurologic: Mental status is normal, cranial nerves are intact, moves all extremities equally.  (all labs ordered are listed, but only abnormal results are displayed) Labs Reviewed  URINALYSIS, W/ REFLEX TO CULTURE (INFECTION SUSPECTED) - Abnormal; Notable for the following components:      Result Value   Color, Urine AMBER (*)    APPearance HAZY (*)    Ketones, ur 5 (*)    All other components within normal limits     .Fecal disimpaction  Date/Time: 02/07/2024 1:31 AM  Performed by: Raford Lenis, MD Authorized by: Raford Lenis, MD  Consent: Verbal consent obtained. Written consent not obtained Risks and benefits: risks, benefits and alternatives were discussed Consent given by: patient Patient understanding: patient states understanding of the procedure being performed Patient consent: the patient's understanding of the procedure matches consent given Procedure consent: procedure consent matches procedure scheduled Relevant documents: relevant documents present and verified Site marked: the operative site was marked Required items: required blood products, implants, devices, and special equipment available Patient identity confirmed: verbally with patient and arm band  Time out: Immediately prior to procedure a time out was called to verify the correct patient, procedure, equipment, support staff and site/side marked as required. Local anesthesia used: no  Anesthesia: Local anesthesia used: no  Sedation: Patient sedated: no  Patient tolerance: patient tolerated the procedure well with no immediate  complications Comments: Attempted disimpaction only yielded a small amount of stool because the impaction was too soft to actually manually remove.      Medications Ordered in the ED  magnesium  citrate solution 1 Bottle (has no administration in time range)                                    Medical Decision Making Risk OTC drugs.   Chronic constipation with soft fecal impaction.  I attempted to manually disimpact her, but the stool was too soft to functionally remove digitally.  I have ordered a soapsuds enema.  I ordered bladder scan to evaluate degree of urinary retention and also in-and-out catheterization.  I have reviewed her past records, and note prior ED visit on 10/27/2019 for chronic constipation and soft fecal impaction.  Bladder scan showed over 500 mL of urine.  Patient had moderate right results from enema but stool was predominantly liquid.  Bladder catheterization yielded 600 mL of urine.  I reviewed her laboratory test, my interpretation is normal urinalysis.  I suspect that her urinary retention was related to mechanical issues related to her fecal impaction.  I have discussed with the patient and with family the option of sending her home with a Foley catheter in place versus watchful waiting and returning to the emergency department if she has recurrent urinary retention.  Through the shared decision making process, decision was made to discharge her without a Foley catheter.  I have ordered a dose of magnesium  citrate and I have advised the patient to use polyethylene glycol on a daily basis and bisacodyl  on an as needed basis.     Final diagnoses:  Fecal impaction in rectum [facility]  Urinary retention    ED Discharge Orders     None          Raford Lenis, MD 02/07/24 [place]    Raford Lenis, MD 02/07/24 [phone]

## 2024-02-07 NOTE — ED Triage Notes (Signed)
 Pt arrives via EMS from home with c/o lower abd pain and not being able to urinate for 24 hrs. Denies N/V/ Diarrhea.

## 2024-02-07 NOTE — ED Notes (Signed)
 Soap suds enema performed. Patient unable to pass bowel.

## 2024-02-07 NOTE — ED Provider Notes (Signed)
 [place] EMERGENCY DEPARTMENT AT [facility] Provider Note   CSN: [id number] Arrival date & time: 02/07/24  1931     Patient presents with: Abdominal Pain and Urinary Retention   Victoria Klein is a 89 y.o. female.    Abdominal Pain Patient presents for lower abdominal pain and inability to urinate.  Her medical history includes GERD, HTN, HLD, aortic stenosis.  She was seen in the ED earlier this morning for constipation.  During that visit, she stated that she had not had a bowel movement in the past 2 weeks.  She was unable to urinate since yesterday morning.  Her bladder scan at that time showed 500 cc of urine.  Bladder catheterization yielded 600 cc.  She underwent fecal disimpaction and enema.  She declined indwelling Foley catheter.  Since her ED visit, she has not been able to urinate.  She has had a small bowel movement.  She has not been drinking fluids because she does not want to worsen the fullness and discomfort she is experiencing in her suprapubic region.     Prior to Admission medications   Medication Sig Start Date End Date Taking? Authorizing Provider  ezetimibe (ZETIA) 10 MG tablet Take 10 mg by mouth daily. 05/13/23   [provider]  hydrochlorothiazide (HYDRODIURIL) 25 MG tablet Take 25 mg by mouth daily. 05/13/23   [provider]  latanoprost (XALATAN) 0.005 % ophthalmic solution Place 1 drop into both eyes at bedtime. 05/13/23   [provider]  losartan (COZAAR) 25 MG tablet Take 25 mg by mouth daily.    [provider]    Allergies: Ace inhibitors and Pravastatin    Review of Systems  Gastrointestinal:  Positive for abdominal pain.  Genitourinary:  Positive for difficulty urinating.  All other systems reviewed and are negative.   Updated Vital Signs BP (!) 163/71   Pulse 79   Temp 97.6 F (36.4 C) (Oral)   Resp 16   Ht 5' 9 (1.753 m)   Wt 24.9 kg   SpO2 97%   BMI 8.09 kg/m   Physical  Exam Vitals and nursing note reviewed.  Constitutional:      General: She is not in acute distress.    Appearance: She is well-developed. She is not ill-appearing, toxic-appearing or diaphoretic.  HENT:     Head: Normocephalic and atraumatic.     Mouth/Throat:     Mouth: Mucous membranes are moist.  Eyes:     Conjunctiva/sclera: Conjunctivae normal.  Cardiovascular:     Rate and Rhythm: Normal rate and regular rhythm.  Pulmonary:     Effort: Pulmonary effort is normal. No respiratory distress.  Abdominal:     Palpations: Abdomen is soft.     Tenderness: There is abdominal tenderness in the suprapubic area. There is no guarding or rebound.  Musculoskeletal:        General: No swelling.     Cervical back: Neck supple.  Skin:    General: Skin is warm and dry.  Neurological:     General: No focal deficit present.     Mental Status: She is alert and oriented to person, place, and time.  Psychiatric:        Mood and Affect: Mood normal.        Behavior: Behavior normal.     (all labs ordered are listed, but only abnormal results are displayed) Labs Reviewed  COMPREHENSIVE METABOLIC PANEL WITH GFR - Abnormal; Notable for the following components:  Result Value   Potassium 3.1 (*)    Chloride 97 (*)    Glucose, Bld 113 (*)    Creatinine, Ser 1.17 (*)    GFR, Estimated 44 (*)    Anion gap 17 (*)    All other components within normal limits  CBC WITH DIFFERENTIAL/PLATELET - Abnormal; Notable for the following components:   Hemoglobin 11.2 (*)    HCT 34.3 (*)    All other components within normal limits  URINALYSIS, ROUTINE W REFLEX MICROSCOPIC - Abnormal; Notable for the following components:   Color, Urine AMBER (*)    APPearance HAZY (*)    All other components within normal limits  LIPASE, BLOOD  MAGNESIUM     EKG: None  Radiology: CT ABDOMEN PELVIS WO CONTRAST Result Date: 02/07/2024 EXAM: CT ABDOMEN AND PELVIS WITHOUT CONTRAST 02/07/2024 09:53:57 PM  TECHNIQUE: CT of the abdomen and pelvis was performed without the administration of intravenous contrast. Multiplanar reformatted images are provided for review. Automated exposure control, iterative reconstruction, and/or weight-based adjustment of the mA/kV was utilized to reduce the radiation dose to as low as reasonably achievable. COMPARISON: CT with iv contrast 10/27/2019 CLINICAL HISTORY: Abdominal pain, acute, nonlocalized. c/o lower abd pain and not being able to urinate for 24 hrs. Denies N/V/ Diarrhea. FINDINGS: LOWER CHEST: The lung bases are clear of infiltrates with scarring and emphysematous changes. The visualized mitral ring is heavily calcified. The cardiac size is normal. There is no pericardial effusion. LIVER: The liver is normal in size and noncontrast attenuation. GALLBLADDER AND BILE DUCTS: Gallbladder and bile ducts are unremarkable. SPLEEN: There is a 1.8 cm cyst in the spleen, hounsfield density is 18. The unenhanced spleen is otherwise unremarkable. PANCREAS: The pancreas is normal in size and contour. No mass is seen without contrast. ADRENAL GLANDS: The left adrenal gland is unremarkable and there is a stable 1.6 cm right adrenal adenoma, and the mean density is 9.9 hounsfield units. No follow-up imaging is recommended. KIDNEYS, URETERS AND BLADDER: In the lower pole of the right kidney, there is a Bosniak 1 cyst measuring 3.2 cm, and the hounsfield density is 12. There is also a right lower pole parapelvic cyst measuring 2.1 cm, hounsfield density is 5.1. Both unenhanced kidneys are otherwise unremarkable. Per consensus, no follow-up is needed for simple Bosniak type 1 and 2 renal cysts, unless the patient has a malignancy history or risk factors. There is no urinary stone or obstruction. No stones in the kidneys or ureters. No hydronephrosis. No perinephric or periureteral stranding. The bladder is catheterized, contracted, and not well seen due to abundant metal spray artifact from  bilateral hip replacements. It is displaced anteriorly by a large rectal stool impaction. GI AND BOWEL: The unopacified stomach and small bowel are unremarkable. An appendix is not seen. There is uncomplicated diverticulosis in the descending and sigmoid colon. The large bowel wall is unremarkable until the rectum, which contains a very large amount of stool, nearly filling the true pelvis and displacing the uterus and bladder anteriorly. There is suspected pneumatosis over portions of the rectal wall suspicious for stercoral proctitis and early wall ischemia but there is no portal venous gas. Disimpaction is strongly recommended. There is no mechanical bowel obstruction. PERITONEUM AND RETROPERITONEUM: No ascites. No free air. No free fluid, free hemorrhage, or incarcerated hernia. VASCULATURE: Aorta is normal in caliber. There is extensive aortic and branch vessel atherosclerosis without aortic aneurysm. LYMPH NODES: No lymphadenopathy. REPRODUCTIVE ORGANS: Uterus and adnexal spaces are largely obscured by  the hip replacements, but no adnexal mass is suspected. BONES AND SOFT TISSUES: Bilateral hip replacements. Degenerative changes in the lumbar spine, greatest at L4-L5 and L5-S1. Acquired foraminal stenosis at L4-L5 and L5-S1. Slight lumbar dextroscoliosis. No regional skeletal fracture or suspicious bone lesion. No focal soft tissue abnormality. IMPRESSION: 1. Large rectal stool impaction with suspected rectal wall pneumatosis over portions over portions , suspicious for stercoral proctitis and early wall ischemia. No portal venous gas. Disimpaction is strongly recommended. 2. Extensive aortic and branch vessel atherosclerosis without aneurysm. 3. Emphysematous and scarring change in the lung bases . 4. Osteopenia degenerative change. Electronically signed by: Francis Quam MD 02/07/2024 10:15 PM EDT RP Workstation: [id number]     Procedures   Medications Ordered in the ED  iohexol  (OMNIPAQUE ) 300 MG/ML  solution 80 mL (has no administration in time range)  sorbitol, magnesium  hydroxide, mineral oil, glycerin (SMOG) enema (has no administration in time range)  cefTRIAXone (ROCEPHIN) 1 g in sodium chloride  0.9 % 100 mL IVPB (1 g Intravenous New Bag/Given 02/07/24 2247)  metroNIDAZOLE (FLAGYL) IVPB 500 mg (has no administration in time range)  lactated ringers bolus 500 mL (0 mLs Intravenous Stopped 02/07/24 2154)  potassium chloride (KLOR-CON) packet 40 mEq (40 mEq Oral Given 02/07/24 2113)                                    Medical Decision Making Amount and/or Complexity of Data Reviewed Labs: ordered. Radiology: ordered.  Risk Prescription drug management. Decision regarding hospitalization.   This patient presents to the ED for concern of abdominal pain, this involves an extensive number of treatment options, and is a complaint that carries with it a high risk of complications and morbidity.  The differential diagnosis includes urine tension, UTI, constipation, colitis, neoplasm   Co morbidities / Chronic conditions that complicate the patient evaluation  GERD, HTN, HLD, aortic stenosis   Additional history obtained:  Additional history obtained from EMR External records from outside source obtained and reviewed including patient's daughter   Lab Tests:  I Ordered, and personally interpreted labs.  The pertinent results include: Baseline creatinine, hypokalemia with otherwise normal electrolytes, normal hemoglobin, no leukocytosis.  No UTI.   Imaging Studies ordered:  I ordered imaging studies including CT of abdomen and pelvis I independently visualized and interpreted imaging which showed large rectal stool impaction with suspected rectal wall pneumatosis, concerning for stercoral proctitis. I agree with the radiologist interpretation   Cardiac Monitoring: / EKG:  The patient was maintained on a cardiac monitor.  I personally viewed and interpreted the cardiac  monitored which showed an underlying rhythm of: Sinus rhythm   Problem List / ED Course / Critical interventions / Medication management  Patient presenting for lower abdominal pain and urine retention.  She was seen in the ED earlier this morning for the same.  She underwent an In-N-Out catheter but declined indwelling Foley catheter.  She has not urinated since that time.  Patient also has a 2-week history of constipation.  She did undergo fecal disimpaction and enema earlier this morning but results were reportedly mostly liquid.  Will plan on placement of Foley catheter and CT imaging.  Foley catheter was placed with 800 cc of dark urine output.  Urinalysis is not consistent with infection.  Patient had significant relief of her discomfort following Foley catheter placement.  Serum lab work is notable for hypokalemia.  Replacement potassium  was ordered.  Creatinine is close to baseline.  After Foley placement, IV fluids ordered for hydration.  CT scan showed concern of stercoral proctitis.  Patient was agreeable to attempted disimpaction and enema.  Smog enema was ordered.  Antibiotics were ordered.  I attempted disimpaction with a return of a small amount of stool.  Most of it was just out of reach.  It is not totally firm.  Enema should have good results.  Patient to be admitted for further management. I ordered medication including IV fluids for hydration, potassium chloride for hypokalemia, smog enema for constipation, ceftriaxone and Flagyl for concern of stercoral proctitis Reevaluation of the patient after these medicines showed that the patient improved I have reviewed the patients home medicines and have made adjustments as needed  Social Determinants of Health:  Has PCP      Final diagnoses:  Constipation, unspecified constipation type  Urinary retention    ED Discharge Orders     None          Melvenia Motto, MD 02/07/24 2313

## 2024-02-07 NOTE — Discharge Instructions (Addendum)
 For the next several days, please make sure to try to urinate every 4 hours.  It is possible that your bladder will stop working again.  If that happens, you will need to come back to the emergency department to have a catheter placed.  Please take polyethylene glycol (MiraLAX ) every day.  Adjust the dose as needed to be able to have regular bowel movements.  It is also acceptable to take bisacodyl  (Dulcolax) on an occasional basis to stimulate your bowels so you can have a bowel movement.  There are also medications that you can take to help you have more frequent bowel movements.  You can discuss this with your primary care provider, or consider getting a referral to see a gastroenterologist.

## 2024-02-07 NOTE — ED Notes (Signed)
 Patient transported to CT

## 2024-02-08 ENCOUNTER — Encounter (HOSPITAL_COMMUNITY): Payer: Self-pay | Admitting: Family Medicine

## 2024-02-08 DIAGNOSIS — E876 Hypokalemia: Secondary | ICD-10-CM | POA: Diagnosis present

## 2024-02-08 DIAGNOSIS — K5289 Other specified noninfective gastroenteritis and colitis: Secondary | ICD-10-CM | POA: Diagnosis not present

## 2024-02-08 DIAGNOSIS — R338 Other retention of urine: Secondary | ICD-10-CM | POA: Diagnosis present

## 2024-02-08 DIAGNOSIS — N1831 Chronic kidney disease, stage 3a: Secondary | ICD-10-CM | POA: Diagnosis present

## 2024-02-08 DIAGNOSIS — I10 Essential (primary) hypertension: Secondary | ICD-10-CM | POA: Diagnosis not present

## 2024-02-08 DIAGNOSIS — K5909 Other constipation: Secondary | ICD-10-CM

## 2024-02-08 LAB — BASIC METABOLIC PANEL WITH GFR
Anion gap: 11 (ref 5–15)
BUN: 13 mg/dL (ref 8–23)
CO2: 27 mmol/L (ref 22–32)
Calcium: 9.6 mg/dL (ref 8.9–10.3)
Chloride: 100 mmol/L (ref 98–111)
Creatinine, Ser: 0.95 mg/dL (ref 0.44–1.00)
GFR, Estimated: 57 mL/min — ABNORMAL LOW (ref >60)
Glucose, Bld: 106 mg/dL — ABNORMAL HIGH (ref 70–99)
Potassium: 3.4 mmol/L — ABNORMAL LOW (ref 3.5–5.1)
Sodium: 137 mmol/L (ref 135–145)

## 2024-02-08 LAB — CBC
HCT: 35.1 % — ABNORMAL LOW (ref 36.0–46.0)
Hemoglobin: 11.4 g/dL — ABNORMAL LOW (ref 12.0–15.0)
MCH: 26.1 pg (ref 26.0–34.0)
MCHC: 32.5 g/dL (ref 30.0–36.0)
MCV: 80.3 fL (ref 80.0–100.0)
Platelets: 329 K/uL (ref 150–400)
RBC: 4.37 MIL/uL (ref 3.87–5.11)
RDW: 14.6 % (ref 11.5–15.5)
WBC: 13.7 K/uL — ABNORMAL HIGH (ref 4.0–10.5)
nRBC: 0 % (ref 0.0–0.2)

## 2024-02-08 MED ORDER — SENNA 8.6 MG PO TABS
1.0000 | ORAL_TABLET | Freq: Two times a day (BID) | ORAL | Status: DC
  Administered 2024-02-08 – 2024-02-10 (×5): 8.6 mg via ORAL
  Filled 2024-02-08 (×5): qty 1

## 2024-02-08 MED ORDER — FENTANYL CITRATE (PF) 50 MCG/ML IJ SOSY: 12.5000 ug | PREFILLED_SYRINGE | INTRAMUSCULAR | Status: DC | PRN

## 2024-02-08 MED ORDER — CHLORHEXIDINE GLUCONATE CLOTH 2 % EX PADS
6.0000 | MEDICATED_PAD | Freq: Every day | CUTANEOUS | Status: DC
  Administered 2024-02-08 – 2024-02-10 (×3): 6 via TOPICAL

## 2024-02-08 MED ORDER — HYDRALAZINE HCL 25 MG PO TABS
25.0000 mg | ORAL_TABLET | Freq: Four times a day (QID) | ORAL | Status: DC | PRN
  Administered 2024-02-08: 25 mg via ORAL
  Filled 2024-02-08: qty 1

## 2024-02-08 MED ORDER — PROCHLORPERAZINE EDISYLATE 10 MG/2ML IJ SOLN: 5.0000 mg | Freq: Four times a day (QID) | INTRAMUSCULAR | Status: DC | PRN

## 2024-02-08 MED ORDER — ENOXAPARIN SODIUM 300 MG/3ML IJ SOLN
20.0000 mg | INTRAMUSCULAR | Status: DC
  Filled 2024-02-08 (×2): qty 0.2

## 2024-02-08 MED ORDER — BOOST / RESOURCE BREEZE PO LIQD CUSTOM
1.0000 | Freq: Three times a day (TID) | ORAL | Status: DC
  Administered 2024-02-08 – 2024-02-10 (×5): 1 via ORAL

## 2024-02-08 MED ORDER — ACETAMINOPHEN 325 MG PO TABS: 650.0000 mg | ORAL_TABLET | Freq: Four times a day (QID) | ORAL | Status: DC | PRN

## 2024-02-08 MED ORDER — PIPERACILLIN-TAZOBACTAM 3.375 G IVPB
3.3750 g | Freq: Three times a day (TID) | INTRAVENOUS | Status: DC
  Administered 2024-02-08 – 2024-02-10 (×7): 3.375 g via INTRAVENOUS
  Filled 2024-02-08 (×7): qty 50

## 2024-02-08 MED ORDER — LATANOPROST 0.005 % OP SOLN
1.0000 [drp] | Freq: Every day | OPHTHALMIC | Status: DC
  Administered 2024-02-08 – 2024-02-09 (×3): 1 [drp] via OPHTHALMIC
  Filled 2024-02-08: qty 2.5

## 2024-02-08 MED ORDER — PIPERACILLIN-TAZOBACTAM IN DEX 2-0.25 GM/50ML IV SOLN
2.2500 g | Freq: Three times a day (TID) | INTRAVENOUS | Status: DC
  Administered 2024-02-08: 2.25 g via INTRAVENOUS
  Filled 2024-02-08 (×5): qty 50

## 2024-02-08 MED ORDER — SODIUM CHLORIDE 0.9% FLUSH
3.0000 mL | Freq: Two times a day (BID) | INTRAVENOUS | Status: DC
  Administered 2024-02-08 – 2024-02-09 (×4): 3 mL via INTRAVENOUS

## 2024-02-08 MED ORDER — ACETAMINOPHEN 650 MG RE SUPP: 650.0000 mg | Freq: Four times a day (QID) | RECTAL | Status: DC | PRN

## 2024-02-08 MED ORDER — OXYCODONE HCL 5 MG PO TABS: 2.5000 mg | ORAL_TABLET | ORAL | Status: DC | PRN

## 2024-02-08 MED ORDER — POLYETHYLENE GLYCOL 3350 17 G PO PACK
17.0000 g | PACK | Freq: Every day | ORAL | Status: DC
  Administered 2024-02-08 – 2024-02-10 (×3): 17 g via ORAL
  Filled 2024-02-08 (×3): qty 1

## 2024-02-08 MED ORDER — DOCUSATE SODIUM 100 MG PO CAPS
100.0000 mg | ORAL_CAPSULE | Freq: Two times a day (BID) | ORAL | Status: DC
  Administered 2024-02-08 – 2024-02-10 (×4): 100 mg via ORAL
  Filled 2024-02-08 (×4): qty 1

## 2024-02-08 MED ORDER — ENOXAPARIN SODIUM 40 MG/0.4ML IJ SOSY
40.0000 mg | PREFILLED_SYRINGE | INTRAMUSCULAR | Status: DC
  Administered 2024-02-08 – 2024-02-09 (×2): 40 mg via SUBCUTANEOUS
  Filled 2024-02-08 (×2): qty 0.4

## 2024-02-08 NOTE — ED Notes (Signed)
 Pt encouraged to hold enema in as along as possible. 2 rounds of enema given with minimal output. Pt states not able to tolerated any more and just wants to rest. Pt transported upstairs at this time.

## 2024-02-08 NOTE — H&P (Signed)
 History and Physical    Victoria Klein FMW:[id number] DOB: 06/04/1931 DOA: 02/07/2024  PCP: [facility]   Patient coming from: Home   Chief Complaint: Unable to urinate, lower abdominal pain  HPI: Victoria Klein is a 89 y.o. female with medical history significant for hypertension, hyperlipidemia, GERD, and chronic constipation who presents with lower abdominal pain, difficulty urinating, and constipation.  Patient was seen in the ED early this morning with the same symptoms, was found to be retaining urine, and she was treated with In-N-Out catheterization and an enema.  She passed some liquid stool with the enema, had relief of her symptoms with In-N-Out catheterization, and returned home.  Since returning home, she has been unable to void and has recurrent lower abdominal pain.  She has only passed a small amount of stool at home.  She denies nausea, vomiting, fevers, or chills.  ED Course: Upon arrival to the ED, patient is found to be afebrile and saturating well on room air with normal HR and stable BP.  Labs are most notable for potassium 3.1, creatinine 1.17, normal LFTs, and normal WBC.  CT demonstrates large rectal stool impaction with suspected rectal wall pneumatosis.  Patient was given a smog enema in the ED, was manually disimpacted, had Foley catheter placed, and was also treated with 500 mL of LR and 40 mEq oral potassium.  Review of Systems:  All other systems reviewed and apart from HPI, are negative.  Past Medical History:  Diagnosis Date   Chronic constipation    Diverticulosis 12/11   GERD (gastroesophageal reflux disease)    H/O: Bell's palsy    Hyperlipemia    Hypertension    S/P colonoscopy 12/11   Dr Vassie fissure, mild colitis    Past Surgical History:  Procedure Laterality Date   ANKLE SURGERY     right    APPENDECTOMY     BACK SURGERY  1985   COLONOSCOPY  04/04/2010   DOQ:unmulnd colon/rare diverticula/small  internal hemorrhoids/anal fissure   TOTAL HIP ARTHROPLASTY  1990's   TRIGGER FINGER RELEASE     tubal ligation      Social History:   reports that she has been smoking cigarettes. She quit smokeless tobacco use about 14 years ago. She reports that she does not drink alcohol and does not use drugs.  Allergies  Allergen Reactions   Ace Inhibitors Other (See Comments)    Arm heaviness   Pravastatin Other (See Comments)    Muscle Pain    History reviewed. No pertinent family history.   Prior to Admission medications   Medication Sig Start Date End Date Taking? Authorizing Provider  ezetimibe (ZETIA) 10 MG tablet Take 10 mg by mouth daily. 05/13/23   [provider]  hydrochlorothiazide (HYDRODIURIL) 25 MG tablet Take 25 mg by mouth daily. 05/13/23   [provider]  latanoprost (XALATAN) 0.005 % ophthalmic solution Place 1 drop into both eyes at bedtime. 05/13/23   [provider]  losartan (COZAAR) 25 MG tablet Take 25 mg by mouth daily.    [provider]    Physical Exam: Vitals:   02/07/24 2300 02/07/24 2330 02/08/24 0000 02/08/24 0043  BP: (!) 172/79 (!) 154/92 (!) 180/76 (!) 195/99  Pulse: 71 67 89 (!) 109  Resp:   19 16  Temp:   98 F (36.7 C) 97.7 F (36.5 C)  TempSrc:   Oral Oral  SpO2: 96% 94% 96% 96%  Weight:    50.6  kg  Height:    5' 8 (1.727 m)    Constitutional: NAD, calm  Eyes: PERTLA, lids and conjunctivae normal ENMT: Mucous membranes are moist. Posterior pharynx clear of any exudate or lesions.   Neck: supple, no masses  Respiratory: no wheezing, no crackles. No accessory muscle use.  Cardiovascular: S1 & S2 heard, regular rate and rhythm. No extremity edema.  Abdomen: Soft, no guarding. Bowel sounds active.  Musculoskeletal: no clubbing / cyanosis. No joint deformity upper and lower extremities.   Skin: no significant rashes, lesions, ulcers. Warm, dry, well-perfused. Neurologic: CN 2-12 grossly intact. Moving all  extremities. Alert and oriented.  Psychiatric: Pleasant. Cooperative.    Labs and Imaging on Admission: I have personally reviewed following labs and imaging studies  CBC: Recent Labs  Lab 02/07/24 2002  WBC 10.1  NEUTROABS 7.7  HGB 11.2*  HCT 34.3*  MCV 80.0  PLT 316   Basic Metabolic Panel: Recent Labs  Lab 02/07/24 2002  NA 139  K 3.1*  CL 97*  CO2 25  GLUCOSE 113*  BUN 14  CREATININE 1.17*  CALCIUM 9.5  MG 2.0   GFR: Estimated Creatinine Clearance: 25.5 mL/min (A) (by C-G formula based on SCr of 1.17 mg/dL (H)). Liver Function Tests: Recent Labs  Lab 02/07/24 2002  AST 29  ALT 11  ALKPHOS 76  BILITOT 0.6  PROT 7.5  ALBUMIN 4.3   Recent Labs  Lab 02/07/24 2002  LIPASE 17   No results for input(s): AMMONIA in the last 168 hours. Coagulation Profile: No results for input(s): INR, PROTIME in the last 168 hours. Cardiac Enzymes: No results for input(s): CKTOTAL, CKMB, CKMBINDEX, TROPONINI in the last 168 hours. BNP (last 3 results) No results for input(s): PROBNP in the last 8760 hours. HbA1C: No results for input(s): HGBA1C in the last 72 hours. CBG: No results for input(s): GLUCAP in the last 168 hours. Lipid Profile: No results for input(s): CHOL, HDL, LDLCALC, TRIG, CHOLHDL, LDLDIRECT in the last 72 hours. Thyroid Function Tests: No results for input(s): TSH, T4TOTAL, FREET4, T3FREE, THYROIDAB in the last 72 hours. Anemia Panel: No results for input(s): VITAMINB12, FOLATE, FERRITIN, TIBC, IRON, RETICCTPCT in the last 72 hours. Urine analysis:    Component Value Date/Time   COLORURINE AMBER (A) 02/07/2024 1955   APPEARANCEUR HAZY (A) 02/07/2024 1955   LABSPEC 1.010 02/07/2024 1955   PHURINE 5.0 02/07/2024 1955   GLUCOSEU NEGATIVE 02/07/2024 1955   HGBUR NEGATIVE 02/07/2024 1955   BILIRUBINUR NEGATIVE 02/07/2024 1955   KETONESUR NEGATIVE 02/07/2024 1955   PROTEINUR NEGATIVE 02/07/2024  1955   UROBILINOGEN 0.2 11/15/2009 1750   NITRITE NEGATIVE 02/07/2024 1955   LEUKOCYTESUR NEGATIVE 02/07/2024 1955   Sepsis Labs: @LABRCNTIP (procalcitonin:4,lacticidven:4) )No results found for this or any previous visit (from the past 240 hours).   Radiological Exams on Admission: CT ABDOMEN PELVIS WO CONTRAST Result Date: 02/07/2024 EXAM: CT ABDOMEN AND PELVIS WITHOUT CONTRAST 02/07/2024 09:53:57 PM TECHNIQUE: CT of the abdomen and pelvis was performed without the administration of intravenous contrast. Multiplanar reformatted images are provided for review. Automated exposure control, iterative reconstruction, and/or weight-based adjustment of the mA/kV was utilized to reduce the radiation dose to as low as reasonably achievable. COMPARISON: CT with iv contrast 10/27/2019 CLINICAL HISTORY: Abdominal pain, acute, nonlocalized. c/o lower abd pain and not being able to urinate for 24 hrs. Denies N/V/ Diarrhea. FINDINGS: LOWER CHEST: The lung bases are clear of infiltrates with scarring and emphysematous changes. The visualized mitral ring is heavily calcified.  The cardiac size is normal. There is no pericardial effusion. LIVER: The liver is normal in size and noncontrast attenuation. GALLBLADDER AND BILE DUCTS: Gallbladder and bile ducts are unremarkable. SPLEEN: There is a 1.8 cm cyst in the spleen, hounsfield density is 18. The unenhanced spleen is otherwise unremarkable. PANCREAS: The pancreas is normal in size and contour. No mass is seen without contrast. ADRENAL GLANDS: The left adrenal gland is unremarkable and there is a stable 1.6 cm right adrenal adenoma, and the mean density is 9.9 hounsfield units. No follow-up imaging is recommended. KIDNEYS, URETERS AND BLADDER: In the lower pole of the right kidney, there is a Bosniak 1 cyst measuring 3.2 cm, and the hounsfield density is 12. There is also a right lower pole parapelvic cyst measuring 2.1 cm, hounsfield density is 5.1. Both unenhanced  kidneys are otherwise unremarkable. Per consensus, no follow-up is needed for simple Bosniak type 1 and 2 renal cysts, unless the patient has a malignancy history or risk factors. There is no urinary stone or obstruction. No stones in the kidneys or ureters. No hydronephrosis. No perinephric or periureteral stranding. The bladder is catheterized, contracted, and not well seen due to abundant metal spray artifact from bilateral hip replacements. It is displaced anteriorly by a large rectal stool impaction. GI AND BOWEL: The unopacified stomach and small bowel are unremarkable. An appendix is not seen. There is uncomplicated diverticulosis in the descending and sigmoid colon. The large bowel wall is unremarkable until the rectum, which contains a very large amount of stool, nearly filling the true pelvis and displacing the uterus and bladder anteriorly. There is suspected pneumatosis over portions of the rectal wall suspicious for stercoral proctitis and early wall ischemia but there is no portal venous gas. Disimpaction is strongly recommended. There is no mechanical bowel obstruction. PERITONEUM AND RETROPERITONEUM: No ascites. No free air. No free fluid, free hemorrhage, or incarcerated hernia. VASCULATURE: Aorta is normal in caliber. There is extensive aortic and branch vessel atherosclerosis without aortic aneurysm. LYMPH NODES: No lymphadenopathy. REPRODUCTIVE ORGANS: Uterus and adnexal spaces are largely obscured by the hip replacements, but no adnexal mass is suspected. BONES AND SOFT TISSUES: Bilateral hip replacements. Degenerative changes in the lumbar spine, greatest at L4-L5 and L5-S1. Acquired foraminal stenosis at L4-L5 and L5-S1. Slight lumbar dextroscoliosis. No regional skeletal fracture or suspicious bone lesion. No focal soft tissue abnormality. IMPRESSION: 1. Large rectal stool impaction with suspected rectal wall pneumatosis over portions over portions , suspicious for stercoral proctitis and  early wall ischemia. No portal venous gas. Disimpaction is strongly recommended. 2. Extensive aortic and branch vessel atherosclerosis without aneurysm. 3. Emphysematous and scarring change in the lung bases . 4. Osteopenia degenerative change. Electronically signed by: Francis Quam MD 02/07/2024 10:15 PM EDT RP Workstation: [id number]    EKG: Independently reviewed. Sinus rhythm, LBBB.   Assessment/Plan   1. Stercoral colitis  - Pneumatosis involving rectal wall noted on CT   - Pain has resolved following disimpaction and placement of Foley catheter in ED  - Continue empiric antibiotics, continue bowel regimen    2. Acute urinary retention  - Required in & out cath in ED early this AM; remained unable to void and Foley was placed in ED tonight  - Continue Foley catheter for now,attempt voiding trial after treatment of fecal impaction with stercoral colitis   3. Hypokalemia  - Replaced in ED, will repeat chem panel in am    4. CKD 3A  - Baseline difficult to determine  based on available labs; SCr was 1.06 in January 2025 and 0.75 in 2021     - Renally-dose medications, hold hydrochlorothiazide and ARB for now, repeat chem panel    5. HTN  - Treat as-needed only for now    DVT prophylaxis: Lovenox   Code Status: Full  Level of Care: Level of care: Telemetry Family Communication: Daughter updated from ED Disposition Plan:  Patient is from: Home  Anticipated d/c is to: TBD Anticipated d/c date is: 02/10/24  Patient currently: pending clinical improvement/stability  Consults called: None Admission status: Inpatient     Victoria GORMAN Sprinkles, MD [facility]  02/08/2024, 1:48 AM

## 2024-02-08 NOTE — TOC CM/SW Note (Signed)
 Transition of Care [facility]) - Inpatient Brief Assessment   Patient Details  Name: Jere Vanburen MRN: [id number] Date of Birth: 1931-07-03  Transition of Care [facility]) CM/SW Contact:    Noreen KATHEE Cleotilde ISRAEL Phone Number: 02/08/2024, 9:16 AM   Clinical Narrative:  Inpatient Care Management (ICM) has reviewed patient and no other ICM needs have been identified at this time. We will continue to monitor patient advancement through interdisciplinary progression rounds. If new patient transition needs arise, please place a ICM consult.  Transition of Care Asessment: Insurance and Status: Insurance coverage has been reviewed Patient has primary care physician: Yes Home environment has been reviewed: Single Family Home Prior level of function:: Independent Prior/Current Home Services: No current home services Social Drivers of Health Review: SDOH reviewed no interventions necessary Readmission risk has been reviewed: Yes Transition of care needs: no transition of care needs at this time

## 2024-02-08 NOTE — Progress Notes (Signed)
 Progress Note   Patient: Victoria Klein FMW:[id number] DOB: 02/14/1932 DOA: 02/07/2024     1 DOS: the patient was seen and examined on 02/08/2024   Brief hospital course: Rolando Whitby is a 89 y.o. female with medical history significant for hypertension, hyperlipidemia, GERD, and chronic constipation who presents with lower abdominal pain, difficulty urinating, and constipation.   Assessment and Plan: Stercoral colitis  Pneumatosis involving rectal wall noted on CT   Abdominal discomfort improved following disimpaction and placement of Foley catheter in ED. Continue constipation regimen. Continue IV zosyn therapy. Advance diet as tolerated.     Acute urinary retention  In the setting of severe constipation. She required in & out cath in ED early this AM; remained unable to void and Foley was placed in ED  Continue Foley catheter for now, attempt voiding trial once she is able to have good bowel movements, ambulating well.    Hypokalemia  Oral replacements ordered.   CKD 3A  Kidney function baseline with IV fluids. Encourage oral diet, supplements. Avoid nephrotoxic drugs. Continue to hold hydrochlorothiazide and ARB for now. Monitor daily BMP.   Hypertension-  Hydralazine  as needed for elevated BP ordered.  Debility- PT/ OT eval for discharge needs.  Moderate malnutrition- BMI 16.96 Encourage oral diet, supplements.   Out of bed to chair. Incentive spirometry. Nursing supportive care. Fall, aspiration precautions. Diet:  Diet Orders (From admission, onward)     Start     Ordered   02/08/24 0054  Diet clear liquid Room service appropriate? Yes; Fluid consistency: Thin  Diet effective now       Question Answer Comment  Room service appropriate? Yes   Fluid consistency: Thin      02/08/24 0055           DVT prophylaxis: enoxaparin  (LOVENOX ) injection 40 mg Start: 02/08/24 1000  Level of care: Telemetry   Code Status: Full Code  Subjective: Patient is  seen and examined today morning She is sitting in chair. Has small BM after, did not eat well. Has foley with dark urine. Daughter at bedside.  Physical Exam: Vitals:   02/08/24 0403 02/08/24 0429 02/08/24 0852 02/08/24 1335  BP: (!) 177/100 (!) 177/100 (!) 141/68 (!) 118/44  Pulse: 92  71 85  Resp: 17   18  Temp: 98.6 F (37 C)  98.3 F (36.8 C) (!) 97.5 F (36.4 C)  TempSrc:   Oral Oral  SpO2: 99%  99% 97%  Weight:      Height:        General - Elderly African American thin built female, no apparent distress HEENT - PERRLA, EOMI, atraumatic head, non tender sinuses. Lung - Clear, no rales, rhonchi, wheezes. Heart - S1, S2 heard, no murmurs, rubs, no pedal edema. Abdomen - Soft, non tender, bowel sounds good Neuro - Alert, awake and oriented, non focal exam. Skin - Warm and dry.  Data Reviewed:      Latest Ref Rng & Units 02/08/2024    4:01 AM 02/07/2024    8:02 PM 05/14/2023   12:19 PM  CBC  WBC 4.0 - 10.5 K/uL 13.7  10.1  9.8   Hemoglobin 12.0 - 15.0 g/dL 88.5  88.7  89.1   Hematocrit 36.0 - 46.0 % 35.1  34.3  34.6   Platelets 150 - 400 K/uL 329  316  368       Latest Ref Rng & Units 02/08/2024    4:01 AM 02/07/2024    8:02 PM 05/14/2023  12:19 PM  BMP  Glucose 70 - 99 mg/dL 893  886  895   BUN 8 - 23 mg/dL 13  14  20    Creatinine 0.44 - 1.00 mg/dL 9.04  8.82  8.93   Sodium 135 - 145 mmol/L 137  139  137   Potassium 3.5 - 5.1 mmol/L 3.4  3.1  3.6   Chloride 98 - 111 mmol/L 100  97  102   CO2 22 - 32 mmol/L 27  25  28    Calcium 8.9 - 10.3 mg/dL 9.6  9.5  9.1    CT ABDOMEN PELVIS WO CONTRAST Result Date: 02/07/2024 EXAM: CT ABDOMEN AND PELVIS WITHOUT CONTRAST 02/07/2024 09:53:57 PM TECHNIQUE: CT of the abdomen and pelvis was performed without the administration of intravenous contrast. Multiplanar reformatted images are provided for review. Automated exposure control, iterative reconstruction, and/or weight-based adjustment of the mA/kV was utilized to reduce  the radiation dose to as low as reasonably achievable. COMPARISON: CT with iv contrast 10/27/2019 CLINICAL HISTORY: Abdominal pain, acute, nonlocalized. c/o lower abd pain and not being able to urinate for 24 hrs. Denies N/V/ Diarrhea. FINDINGS: LOWER CHEST: The lung bases are clear of infiltrates with scarring and emphysematous changes. The visualized mitral ring is heavily calcified. The cardiac size is normal. There is no pericardial effusion. LIVER: The liver is normal in size and noncontrast attenuation. GALLBLADDER AND BILE DUCTS: Gallbladder and bile ducts are unremarkable. SPLEEN: There is a 1.8 cm cyst in the spleen, hounsfield density is 18. The unenhanced spleen is otherwise unremarkable. PANCREAS: The pancreas is normal in size and contour. No mass is seen without contrast. ADRENAL GLANDS: The left adrenal gland is unremarkable and there is a stable 1.6 cm right adrenal adenoma, and the mean density is 9.9 hounsfield units. No follow-up imaging is recommended. KIDNEYS, URETERS AND BLADDER: In the lower pole of the right kidney, there is a Bosniak 1 cyst measuring 3.2 cm, and the hounsfield density is 12. There is also a right lower pole parapelvic cyst measuring 2.1 cm, hounsfield density is 5.1. Both unenhanced kidneys are otherwise unremarkable. Per consensus, no follow-up is needed for simple Bosniak type 1 and 2 renal cysts, unless the patient has a malignancy history or risk factors. There is no urinary stone or obstruction. No stones in the kidneys or ureters. No hydronephrosis. No perinephric or periureteral stranding. The bladder is catheterized, contracted, and not well seen due to abundant metal spray artifact from bilateral hip replacements. It is displaced anteriorly by a large rectal stool impaction. GI AND BOWEL: The unopacified stomach and small bowel are unremarkable. An appendix is not seen. There is uncomplicated diverticulosis in the descending and sigmoid colon. The large bowel wall  is unremarkable until the rectum, which contains a very large amount of stool, nearly filling the true pelvis and displacing the uterus and bladder anteriorly. There is suspected pneumatosis over portions of the rectal wall suspicious for stercoral proctitis and early wall ischemia but there is no portal venous gas. Disimpaction is strongly recommended. There is no mechanical bowel obstruction. PERITONEUM AND RETROPERITONEUM: No ascites. No free air. No free fluid, free hemorrhage, or incarcerated hernia. VASCULATURE: Aorta is normal in caliber. There is extensive aortic and branch vessel atherosclerosis without aortic aneurysm. LYMPH NODES: No lymphadenopathy. REPRODUCTIVE ORGANS: Uterus and adnexal spaces are largely obscured by the hip replacements, but no adnexal mass is suspected. BONES AND SOFT TISSUES: Bilateral hip replacements. Degenerative changes in the lumbar spine, greatest at L4-L5  and L5-S1. Acquired foraminal stenosis at L4-L5 and L5-S1. Slight lumbar dextroscoliosis. No regional skeletal fracture or suspicious bone lesion. No focal soft tissue abnormality. IMPRESSION: 1. Large rectal stool impaction with suspected rectal wall pneumatosis over portions over portions , suspicious for stercoral proctitis and early wall ischemia. No portal venous gas. Disimpaction is strongly recommended. 2. Extensive aortic and branch vessel atherosclerosis without aneurysm. 3. Emphysematous and scarring change in the lung bases . 4. Osteopenia degenerative change. Electronically signed by: Francis Quam MD 02/07/2024 10:15 PM EDT RP Workstation: [id number]    Family Communication: Discussed with patient, daughter at bedside. They understand and agree. All questions answered.  Disposition: Status is: Inpatient Remains inpatient appropriate because: colitis, constipation,  Planned Discharge Destination: Home with Home Health     Time spent: 44 minutes  Author: Concepcion Riser, MD 02/08/2024 5:00  PM Secure chat 7am to 7pm For on call review [email or web address].

## 2024-02-08 NOTE — Plan of Care (Signed)

## 2024-02-09 DIAGNOSIS — K5289 Other specified noninfective gastroenteritis and colitis: Secondary | ICD-10-CM | POA: Diagnosis not present

## 2024-02-09 LAB — BASIC METABOLIC PANEL WITH GFR
Anion gap: 8 (ref 5–15)
BUN: 13 mg/dL (ref 8–23)
CO2: 29 mmol/L (ref 22–32)
Calcium: 8.9 mg/dL (ref 8.9–10.3)
Chloride: 102 mmol/L (ref 98–111)
Creatinine, Ser: 1.12 mg/dL — ABNORMAL HIGH (ref 0.44–1.00)
GFR, Estimated: 46 mL/min — ABNORMAL LOW (ref >60)
Glucose, Bld: 81 mg/dL (ref 70–99)
Potassium: 3 mmol/L — ABNORMAL LOW (ref 3.5–5.1)
Sodium: 139 mmol/L (ref 135–145)

## 2024-02-09 LAB — CBC
HCT: 30.4 % — ABNORMAL LOW (ref 36.0–46.0)
Hemoglobin: 9.7 g/dL — ABNORMAL LOW (ref 12.0–15.0)
MCH: 25.7 pg — ABNORMAL LOW (ref 26.0–34.0)
MCHC: 31.9 g/dL (ref 30.0–36.0)
MCV: 80.6 fL (ref 80.0–100.0)
Platelets: 274 K/uL (ref 150–400)
RBC: 3.77 MIL/uL — ABNORMAL LOW (ref 3.87–5.11)
RDW: 14.6 % (ref 11.5–15.5)
WBC: 8.4 K/uL (ref 4.0–10.5)
nRBC: 0 % (ref 0.0–0.2)

## 2024-02-09 MED ORDER — SODIUM CHLORIDE 0.9 % IV SOLN: INTRAVENOUS | Status: AC

## 2024-02-09 MED ORDER — POTASSIUM CHLORIDE 20 MEQ PO PACK
20.0000 meq | PACK | Freq: Three times a day (TID) | ORAL | Status: AC
  Administered 2024-02-09 – 2024-02-10 (×4): 20 meq via ORAL
  Filled 2024-02-09 (×4): qty 1

## 2024-02-09 MED ORDER — ENOXAPARIN SODIUM 30 MG/0.3ML IJ SOSY: 30.0000 mg | PREFILLED_SYRINGE | INTRAMUSCULAR | Status: DC

## 2024-02-09 NOTE — Care Management Important Message (Signed)
 Important Message  Patient Details  Name: Victoria Klein MRN: [id number] Date of Birth: 05-30-1931   Important Message Given:  N/A - LOS <3 / Initial given by admissions     Duwaine LITTIE Ada 02/09/2024, 8:47 AM

## 2024-02-09 NOTE — Progress Notes (Signed)
 Progress Note   Patient: Victoria Klein FMW:[id number] DOB: 1932/04/23 DOA: 02/07/2024     2 DOS: the patient was seen and examined on 02/09/2024   Brief hospital course: Victoria Klein is a 89 y.o. female with medical history significant for hypertension, hyperlipidemia, GERD, and chronic constipation who presents with lower abdominal pain, difficulty urinating, and constipation.   Assessment and Plan: Stercoral colitis  Pneumatosis involving rectal wall noted on CT   Abdominal discomfort improved following disimpaction and placement of Foley catheter in ED. Continue constipation regimen.  Currently receiving Senokot and MiraLAX  scheduled Continue IV zosyn therapy. Advance diet as tolerated.     Acute urinary retention  In the setting of severe constipation. She required in & out cath in ED, ultimately Foley had to be placed.  She feels better.  Will remove Foley catheter today, bladder scan if no voiding.  Hypokalemia  Oral replacements ordered today again, potassium 3.0.  Recheck labs in the a.m.   CKD 3A  Kidney function baseline with IV fluids.  Will continue with IV fluid.  NS at 50 cc/h ordered.  Negative urine output noted with very little intake charted.  Will continue to monitor  encourage oral diet, supplements. Avoid nephrotoxic drugs. Continue to hold hydrochlorothiazide and ARB for now. Monitor daily BMP.   Hypertension-  Hydralazine  as needed for elevated BP ordered.  Debility- PT/ OT eval for discharge needs.  Moderate malnutrition- BMI 16.96 Encourage oral diet, supplements.   Out of bed to chair. Incentive spirometry. Nursing supportive care. Fall, aspiration precautions. Diet:  Diet Orders (From admission, onward)     Start     Ordered   02/08/24 1703  DIET SOFT Room service appropriate? Yes; Fluid consistency: Thin  Diet effective now       Question Answer Comment  Room service appropriate? Yes   Fluid consistency: Thin      02/08/24 1702            DVT prophylaxis: enoxaparin  (LOVENOX ) injection 40 mg Start: 02/08/24 1000  Level of care: Telemetry   Code Status: Full Code  Subjective:   Patient seen and examined at the bedside.  His daughter is present.  Patient reports improvement in abdominal discomfort.  She denies nausea or vomiting.  Tolerating diet well.  She also reports of having good bowel movements.  Agrees with Foley removal.   Physical Exam: Vitals:   02/08/24 0852 02/08/24 1335 02/08/24 2023 02/09/24 0611  BP: (!) 141/68 (!) 118/44 (!) 128/57 134/71  Pulse: 71 85 64 65  Resp:  18 18 18   Temp: 98.3 F (36.8 C) (!) 97.5 F (36.4 C) 98.7 F (37.1 C) 98.8 F (37.1 C)  TempSrc: Oral Oral Oral Oral  SpO2: 99% 97% 95% 96%  Weight:      Height:        General - Elderly African American thin built female, no apparent distress HEENT - PERRLA, EOMI, atraumatic head, non tender sinuses. Lung - Clear, no rales, rhonchi, wheezes. Heart - S1, S2 heard, no murmurs, rubs, no pedal edema. Abdomen - Soft, non tender, bowel sounds good Neuro - Alert, awake and oriented, non focal exam. Skin - Warm and dry.  Data Reviewed:      Latest Ref Rng & Units 02/09/2024    3:54 AM 02/08/2024    4:01 AM 02/07/2024    8:02 PM  CBC  WBC 4.0 - 10.5 K/uL 8.4  13.7  10.1   Hemoglobin 12.0 - 15.0 g/dL 9.7  11.4  11.2   Hematocrit 36.0 - 46.0 % 30.4  35.1  34.3   Platelets 150 - 400 K/uL 274  329  316       Latest Ref Rng & Units 02/09/2024    3:54 AM 02/08/2024    4:01 AM 02/07/2024    8:02 PM  BMP  Glucose 70 - 99 mg/dL 81  893  886   BUN 8 - 23 mg/dL 13  13  14    Creatinine 0.44 - 1.00 mg/dL 8.87  9.04  8.82   Sodium 135 - 145 mmol/L 139  137  139   Potassium 3.5 - 5.1 mmol/L 3.0  3.4  3.1   Chloride 98 - 111 mmol/L 102  100  97   CO2 22 - 32 mmol/L 29  27  25    Calcium 8.9 - 10.3 mg/dL 8.9  9.6  9.5    CT ABDOMEN PELVIS WO CONTRAST Result Date: 02/07/2024 EXAM: CT ABDOMEN AND PELVIS WITHOUT CONTRAST  02/07/2024 09:53:57 PM TECHNIQUE: CT of the abdomen and pelvis was performed without the administration of intravenous contrast. Multiplanar reformatted images are provided for review. Automated exposure control, iterative reconstruction, and/or weight-based adjustment of the mA/kV was utilized to reduce the radiation dose to as low as reasonably achievable. COMPARISON: CT with iv contrast 10/27/2019 CLINICAL HISTORY: Abdominal pain, acute, nonlocalized. c/o lower abd pain and not being able to urinate for 24 hrs. Denies N/V/ Diarrhea. FINDINGS: LOWER CHEST: The lung bases are clear of infiltrates with scarring and emphysematous changes. The visualized mitral ring is heavily calcified. The cardiac size is normal. There is no pericardial effusion. LIVER: The liver is normal in size and noncontrast attenuation. GALLBLADDER AND BILE DUCTS: Gallbladder and bile ducts are unremarkable. SPLEEN: There is a 1.8 cm cyst in the spleen, hounsfield density is 18. The unenhanced spleen is otherwise unremarkable. PANCREAS: The pancreas is normal in size and contour. No mass is seen without contrast. ADRENAL GLANDS: The left adrenal gland is unremarkable and there is a stable 1.6 cm right adrenal adenoma, and the mean density is 9.9 hounsfield units. No follow-up imaging is recommended. KIDNEYS, URETERS AND BLADDER: In the lower pole of the right kidney, there is a Bosniak 1 cyst measuring 3.2 cm, and the hounsfield density is 12. There is also a right lower pole parapelvic cyst measuring 2.1 cm, hounsfield density is 5.1. Both unenhanced kidneys are otherwise unremarkable. Per consensus, no follow-up is needed for simple Bosniak type 1 and 2 renal cysts, unless the patient has a malignancy history or risk factors. There is no urinary stone or obstruction. No stones in the kidneys or ureters. No hydronephrosis. No perinephric or periureteral stranding. The bladder is catheterized, contracted, and not well seen due to abundant  metal spray artifact from bilateral hip replacements. It is displaced anteriorly by a large rectal stool impaction. GI AND BOWEL: The unopacified stomach and small bowel are unremarkable. An appendix is not seen. There is uncomplicated diverticulosis in the descending and sigmoid colon. The large bowel wall is unremarkable until the rectum, which contains a very large amount of stool, nearly filling the true pelvis and displacing the uterus and bladder anteriorly. There is suspected pneumatosis over portions of the rectal wall suspicious for stercoral proctitis and early wall ischemia but there is no portal venous gas. Disimpaction is strongly recommended. There is no mechanical bowel obstruction. PERITONEUM AND RETROPERITONEUM: No ascites. No free air. No free fluid, free hemorrhage, or incarcerated hernia. VASCULATURE: Aorta  is normal in caliber. There is extensive aortic and branch vessel atherosclerosis without aortic aneurysm. LYMPH NODES: No lymphadenopathy. REPRODUCTIVE ORGANS: Uterus and adnexal spaces are largely obscured by the hip replacements, but no adnexal mass is suspected. BONES AND SOFT TISSUES: Bilateral hip replacements. Degenerative changes in the lumbar spine, greatest at L4-L5 and L5-S1. Acquired foraminal stenosis at L4-L5 and L5-S1. Slight lumbar dextroscoliosis. No regional skeletal fracture or suspicious bone lesion. No focal soft tissue abnormality. IMPRESSION: 1. Large rectal stool impaction with suspected rectal wall pneumatosis over portions over portions , suspicious for stercoral proctitis and early wall ischemia. No portal venous gas. Disimpaction is strongly recommended. 2. Extensive aortic and branch vessel atherosclerosis without aneurysm. 3. Emphysematous and scarring change in the lung bases . 4. Osteopenia degenerative change. Electronically signed by: Francis Quam MD 02/07/2024 10:15 PM EDT RP Workstation: [id number]    Family Communication: Discussed with patient,  daughter at bedside. They understand and agree. All questions answered.  Disposition: Status is: Inpatient Remains inpatient appropriate because: colitis, constipation,  Planned Discharge Destination: Home with Home Health     Time spent: 44 minutes  Author: Derryl Duval, MD 02/09/2024 8:12 AM Secure chat 7am to 7pm For on call review [email or web address].

## 2024-02-10 DIAGNOSIS — K5289 Other specified noninfective gastroenteritis and colitis: Secondary | ICD-10-CM | POA: Diagnosis not present

## 2024-02-10 LAB — BASIC METABOLIC PANEL WITH GFR
Anion gap: 8 (ref 5–15)
BUN: 15 mg/dL (ref 8–23)
CO2: 26 mmol/L (ref 22–32)
Calcium: 8.5 mg/dL — ABNORMAL LOW (ref 8.9–10.3)
Chloride: 106 mmol/L (ref 98–111)
Creatinine, Ser: 1.08 mg/dL — ABNORMAL HIGH (ref 0.44–1.00)
GFR, Estimated: 49 mL/min — ABNORMAL LOW (ref >60)
Glucose, Bld: 78 mg/dL (ref 70–99)
Potassium: 3.3 mmol/L — ABNORMAL LOW (ref 3.5–5.1)
Sodium: 140 mmol/L (ref 135–145)

## 2024-02-10 LAB — CBC
HCT: 28.8 % — ABNORMAL LOW (ref 36.0–46.0)
Hemoglobin: 9.2 g/dL — ABNORMAL LOW (ref 12.0–15.0)
MCH: 26.1 pg (ref 26.0–34.0)
MCHC: 31.9 g/dL (ref 30.0–36.0)
MCV: 81.8 fL (ref 80.0–100.0)
Platelets: 259 K/uL (ref 150–400)
RBC: 3.52 MIL/uL — ABNORMAL LOW (ref 3.87–5.11)
RDW: 14.8 % (ref 11.5–15.5)
WBC: 8.1 K/uL (ref 4.0–10.5)
nRBC: 0 % (ref 0.0–0.2)

## 2024-02-10 MED ORDER — AMLODIPINE BESYLATE 5 MG PO TABS: 5.0000 mg | ORAL_TABLET | Freq: Every day | ORAL | 0 refills | Status: AC

## 2024-02-10 MED ORDER — AMLODIPINE BESYLATE 5 MG PO TABS
5.0000 mg | ORAL_TABLET | Freq: Every day | ORAL | Status: DC
  Administered 2024-02-10: 5 mg via ORAL
  Filled 2024-02-10: qty 1

## 2024-02-10 MED ORDER — SENNA 8.6 MG PO TABS
1.0000 | ORAL_TABLET | Freq: Two times a day (BID) | ORAL | 0 refills | Status: AC | PRN
Start: 2024-02-10 — End: ?

## 2024-02-10 MED ORDER — AMOXICILLIN-POT CLAVULANATE 875-125 MG PO TABS: 1.0000 | ORAL_TABLET | Freq: Two times a day (BID) | ORAL | 0 refills | Status: AC

## 2024-02-10 NOTE — Care Management Important Message (Signed)
 Important Message  Patient Details  Name: Victoria Klein MRN: [id number] Date of Birth: Feb 22, 1932   Important Message Given:  N/A - LOS <3 / Initial given by admissions     Duwaine LITTIE Ada 02/10/2024, 11:02 AM

## 2024-02-10 NOTE — Plan of Care (Signed)
  Problem: Clinical Measurements: Goal: Diagnostic test results will improve Outcome: Progressing   Problem: Education: Goal: Knowledge of General Education information will improve Description: Including pain rating scale, medication(s)/side effects and non-pharmacologic comfort measures Outcome: Adequate for Discharge   Problem: Health Behavior/Discharge Planning: Goal: Ability to manage health-related needs will improve Outcome: Adequate for Discharge   Problem: Clinical Measurements: Goal: Respiratory complications will improve Outcome: Adequate for Discharge Goal: Cardiovascular complication will be avoided Outcome: Adequate for Discharge   Problem: Activity: Goal: Risk for activity intolerance will decrease Outcome: Adequate for Discharge

## 2024-02-10 NOTE — Discharge Summary (Signed)
 Physician Discharge Summary  [name] FMW:[id number] DOB: [birth year] DOA: 02/07/2024  PCP: [facility]  Admit date: 02/07/2024 Discharge date: 02/10/2024  Admitted From: Home Disposition: Home  Recommendations for Outpatient Follow-up:  Follow up with PCP in 1 week with repeat BMP Follow up in ED if symptoms worsen or new appear   Discharge Condition: Stable CODE STATUS: Full Diet recommendation: Heart healthy  Brief/Interim Summary:  Brief hospital course: Victoria Klein is a 89 y.o. female with medical history significant for hypertension, hyperlipidemia, GERD, and chronic constipation who presents with lower abdominal pain, difficulty urinating, and constipation.  CT scan revealed significant stool load in the rectum with pneumatosis involving rectal wall.  Disimpaction and Foley catheter was placed in the ER.  Patient was placed on bowel regimen and she started to have regular bowel movements.  She was also initiated on IV Zosyn empirically.  Foley catheter was removed today prior to discharge.  She has been tolerating diet well and voiding without issues.  She will be prescribed 7 days of Augmentin to complete a course for stercoral colitis/proctitis.  Of note she was noted to be hypokalemic and received potassium supplements.  Her creatinine was also elevated to 1.17 on admission with baseline between 0.7-0.9.  She received IV fluid.  Creatinine 1.08 on the day of discharge.  She need to follow-up with PCP and obtain laboratory work within a week.  Secondary to AKI, antihypertensives hydrochlorothiazide and losartan were kept on hold.  Given her age and AKI with risk of dehydration, hydrochlorothiazide is discontinued.  She will need to follow-up with PCP regarding losartan initiation. in the meantime she is initiated on amlodipine 5 mg daily grams for optimal blood pressure control. Plan to discharge home. Will be evaluated by PT/case management for  disposition need.   Discharge Diagnoses:  Principal Problem:   Stercoral colitis Active Problems:   Constipation   Essential hypertension   Hypokalemia   CKD stage 3a, GFR 45-59 ml/min (HCC)   Acute urinary retention    Discharge Instructions  Discharge Instructions     Diet - low sodium heart healthy   Complete by: As directed    Discharge instructions   Complete by: As directed    1.  Stop taking hydrochlorothiazide due to AKI.  Hold off on losartan for now due to AKI.  Resume at the discretion of primary care provider. 2.  Please start taking amlodipine 5 mg daily prescribed for blood pressure control. 3.  Please take Augmentin 1 tablet twice daily for 7 days for rectal inflammation 4.  Take over-the-counter MiraLAX  once or twice daily to ensure daily bowel movements.   Increase activity slowly   Complete by: As directed       Allergies as of 02/10/2024       Reactions   Ace Inhibitors Other (See Comments), Cough   Arm heaviness   Pravastatin Other (See Comments)   Muscle Pain        Medication List     PAUSE taking these medications    losartan 25 MG tablet Wait to take this until your doctor or other care provider tells you to start again. Commonly known as: COZAAR Take 25 mg by mouth daily.       STOP taking these medications    hydrochlorothiazide 25 MG tablet Commonly known as: HYDRODIURIL       TAKE these medications    amLODipine 5 MG tablet Commonly known as: NORVASC Take 1 tablet (  5 mg total) by mouth daily. Start taking on: February 11, 2024   amoxicillin-clavulanate 875-125 MG tablet Commonly known as: AUGMENTIN Take 1 tablet by mouth 2 (two) times daily.   ezetimibe 10 MG tablet Commonly known as: ZETIA Take 10 mg by mouth daily.   latanoprost 0.005 % ophthalmic solution Commonly known as: XALATAN Place 1 drop into both eyes at bedtime.   senna 8.6 MG Tabs tablet Commonly known as: SENOKOT Take 1 tablet (8.6 mg total)  by mouth 2 (two) times daily as needed for mild constipation.        Allergies  Allergen Reactions   Ace Inhibitors Other (See Comments) and Cough    Arm heaviness   Pravastatin Other (See Comments)    Muscle Pain    Consultations:    Procedures/Studies: CT ABDOMEN PELVIS WO CONTRAST Result Date: 02/07/2024 EXAM: CT ABDOMEN AND PELVIS WITHOUT CONTRAST 02/07/2024 09:53:57 PM TECHNIQUE: CT of the abdomen and pelvis was performed without the administration of intravenous contrast. Multiplanar reformatted images are provided for review. Automated exposure control, iterative reconstruction, and/or weight-based adjustment of the mA/kV was utilized to reduce the radiation dose to as low as reasonably achievable. COMPARISON: CT with iv contrast 10/27/2019 CLINICAL HISTORY: Abdominal pain, acute, nonlocalized. c/o lower abd pain and not being able to urinate for 24 hrs. Denies N/V/ Diarrhea. FINDINGS: LOWER CHEST: The lung bases are clear of infiltrates with scarring and emphysematous changes. The visualized mitral ring is heavily calcified. The cardiac size is normal. There is no pericardial effusion. LIVER: The liver is normal in size and noncontrast attenuation. GALLBLADDER AND BILE DUCTS: Gallbladder and bile ducts are unremarkable. SPLEEN: There is a 1.8 cm cyst in the spleen, hounsfield density is 18. The unenhanced spleen is otherwise unremarkable. PANCREAS: The pancreas is normal in size and contour. No mass is seen without contrast. ADRENAL GLANDS: The left adrenal gland is unremarkable and there is a stable 1.6 cm right adrenal adenoma, and the mean density is 9.9 hounsfield units. No follow-up imaging is recommended. KIDNEYS, URETERS AND BLADDER: In the lower pole of the right kidney, there is a Bosniak 1 cyst measuring 3.2 cm, and the hounsfield density is 12. There is also a right lower pole parapelvic cyst measuring 2.1 cm, hounsfield density is 5.1. Both unenhanced kidneys are otherwise  unremarkable. Per consensus, no follow-up is needed for simple Bosniak type 1 and 2 renal cysts, unless the patient has a malignancy history or risk factors. There is no urinary stone or obstruction. No stones in the kidneys or ureters. No hydronephrosis. No perinephric or periureteral stranding. The bladder is catheterized, contracted, and not well seen due to abundant metal spray artifact from bilateral hip replacements. It is displaced anteriorly by a large rectal stool impaction. GI AND BOWEL: The unopacified stomach and small bowel are unremarkable. An appendix is not seen. There is uncomplicated diverticulosis in the descending and sigmoid colon. The large bowel wall is unremarkable until the rectum, which contains a very large amount of stool, nearly filling the true pelvis and displacing the uterus and bladder anteriorly. There is suspected pneumatosis over portions of the rectal wall suspicious for stercoral proctitis and early wall ischemia but there is no portal venous gas. Disimpaction is strongly recommended. There is no mechanical bowel obstruction. PERITONEUM AND RETROPERITONEUM: No ascites. No free air. No free fluid, free hemorrhage, or incarcerated hernia. VASCULATURE: Aorta is normal in caliber. There is extensive aortic and branch vessel atherosclerosis without aortic aneurysm. LYMPH NODES: No  lymphadenopathy. REPRODUCTIVE ORGANS: Uterus and adnexal spaces are largely obscured by the hip replacements, but no adnexal mass is suspected. BONES AND SOFT TISSUES: Bilateral hip replacements. Degenerative changes in the lumbar spine, greatest at L4-L5 and L5-S1. Acquired foraminal stenosis at L4-L5 and L5-S1. Slight lumbar dextroscoliosis. No regional skeletal fracture or suspicious bone lesion. No focal soft tissue abnormality. IMPRESSION: 1. Large rectal stool impaction with suspected rectal wall pneumatosis over portions over portions , suspicious for stercoral proctitis and early wall ischemia. No  portal venous gas. Disimpaction is strongly recommended. 2. Extensive aortic and branch vessel atherosclerosis without aneurysm. 3. Emphysematous and scarring change in the lung bases . 4. Osteopenia degenerative change. Electronically signed by: Francis Quam MD 02/07/2024 10:15 PM EDT RP Workstation: [id number]      Subjective:   Discharge Exam: Vitals:   02/09/24 2011 02/10/24 0316  BP: (!) 152/57 (!) 160/61  Pulse: 66 63  Resp: 18 18  Temp: 98.1 F (36.7 C) 97.9 F (36.6 C)  SpO2: 97% 98%    General: Pt is alert, awake, not in acute distress Cardiovascular: rate controlled, S1/S2 + Respiratory: bilateral decreased breath sounds at bases Abdominal: Soft, NT, ND, bowel sounds + Extremities: no edema, no cyanosis    The results of significant diagnostics from this hospitalization (including imaging, microbiology, ancillary and laboratory) are listed below for reference.     Microbiology: No results found for this or any previous visit (from the past 240 hours).   Labs: BNP (last 3 results) No results for input(s): BNP in the last 8760 hours. Basic Metabolic Panel: Recent Labs  Lab 02/07/24 2002 02/08/24 0401 02/09/24 0354 02/10/24 0353  NA 139 137 139 140  K 3.1* 3.4* 3.0* 3.3*  CL 97* 100 102 106  CO2 25 27 29 26   GLUCOSE 113* 106* 81 78  BUN 14 13 13 15   CREATININE 1.17* 0.95 1.12* 1.08*  CALCIUM 9.5 9.6 8.9 8.5*  MG 2.0  --   --   --    Liver Function Tests: Recent Labs  Lab 02/07/24 2002  AST 29  ALT 11  ALKPHOS 76  BILITOT 0.6  PROT 7.5  ALBUMIN 4.3   Recent Labs  Lab 02/07/24 2002  LIPASE 17   No results for input(s): AMMONIA in the last 168 hours. CBC: Recent Labs  Lab 02/07/24 2002 02/08/24 0401 02/09/24 0354 02/10/24 0353  WBC 10.1 13.7* 8.4 8.1  NEUTROABS 7.7  --   --   --   HGB 11.2* 11.4* 9.7* 9.2*  HCT 34.3* 35.1* 30.4* 28.8*  MCV 80.0 80.3 80.6 81.8  PLT 316 329 274 259   Cardiac Enzymes: No results for input(s):  CKTOTAL, CKMB, CKMBINDEX, TROPONINI in the last 168 hours. BNP: Invalid input(s): POCBNP CBG: No results for input(s): GLUCAP in the last 168 hours. D-Dimer No results for input(s): DDIMER in the last 72 hours. Hgb A1c No results for input(s): HGBA1C in the last 72 hours. Lipid Profile No results for input(s): CHOL, HDL, LDLCALC, TRIG, CHOLHDL, LDLDIRECT in the last 72 hours. Thyroid function studies No results for input(s): TSH, T4TOTAL, T3FREE, THYROIDAB in the last 72 hours.  Invalid input(s): FREET3 Anemia work up No results for input(s): VITAMINB12, FOLATE, FERRITIN, TIBC, IRON, RETICCTPCT in the last 72 hours. Urinalysis    Component Value Date/Time   COLORURINE AMBER (A) 02/07/2024 1955   APPEARANCEUR HAZY (A) 02/07/2024 1955   LABSPEC 1.010 02/07/2024 1955   PHURINE 5.0 02/07/2024 1955   GLUCOSEU NEGATIVE 02/07/2024  1955   HGBUR NEGATIVE 02/07/2024 1955   BILIRUBINUR NEGATIVE 02/07/2024 1955   KETONESUR NEGATIVE 02/07/2024 1955   PROTEINUR NEGATIVE 02/07/2024 1955   UROBILINOGEN 0.2 11/15/2009 1750   NITRITE NEGATIVE 02/07/2024 1955   LEUKOCYTESUR NEGATIVE 02/07/2024 1955   Sepsis Labs Recent Labs  Lab 02/07/24 2002 02/08/24 0401 02/09/24 0354 02/10/24 0353  WBC 10.1 13.7* 8.4 8.1   Microbiology No results found for this or any previous visit (from the past 240 hours).   Time coordinating discharge: 35 minutes  SIGNED:   Elycia Woodside, MD  [facility] 02/10/2024, 10:42 AM

## 2024-02-10 NOTE — Plan of Care (Signed)
   Problem: Education: Goal: Knowledge of General Education information will improve Description Including pain rating scale, medication(s)/side effects and non-pharmacologic comfort measures Outcome: Progressing   Problem: Health Behavior/Discharge Planning: Goal: Ability to manage health-related needs will improve Outcome: Progressing

## 2024-02-10 NOTE — Progress Notes (Signed)
 PT Cancellation Note  Patient Details Name: Victoria Klein MRN: [id number] DOB: [birth year]   Cancelled Treatment:    Reason Eval/Treat Not Completed: PT screened, no needs identified, will sign off. Patient ambulating independently in room.  10:59 AM, 02/10/24 Lynwood Music, MPT Physical Therapist with [facility] [phone] office [phone] mobile phone

## 2024-02-10 NOTE — TOC Transition Note (Signed)
 Transition of Care [facility]) - Discharge Note   Patient Details  Name: Victoria Klein MRN: [id number] Date of Birth: 01/07/1932  [facility] CM/SW Contact:  Noreen KATHEE Cleotilde ISRAEL Phone Number: 02/10/2024, 11:25 AM   Clinical Narrative:     Patient is DC today. No recommendation from PT. CSW did reach out to daughter regarding DME - daughter asked if patient could get a Rollator. CSW reached out to Zach with [employer] and asked MD to place order. As long as patient has not had any equipment purchased through her insurance in the last 5 years, her insurance will cover for Rollator. CSW signing off.  Final next level of care: Home/Self Care Barriers to Discharge: Barriers Resolved   Patient Goals and CMS Choice Patient states their goals for this hospitalization and ongoing recovery are:: return back home CMS Medicare.gov Compare Post Acute Care list provided to:: Patient Represenative (must comment) (daughter) Choice offered to / list presented to : Adult Children      Discharge Placement                Patient to be transferred to facility by: POV Name of family member notified: Elizabeth-daughter Patient and family notified of of transfer: 02/10/24  Discharge Plan and Services Additional resources added to the After Visit Summary for                  DME Arranged: Walker rolling with seat DME Agency: [employer] Date DME Agency Contacted: 02/10/24 Time DME Agency Contacted: 1125 Representative spoke with at DME Agency: Darlyn            Social Drivers of Health (SDOH) Interventions SDOH Screenings   Food Insecurity: No Food Insecurity (02/08/2024)  Housing: Low Risk  (02/08/2024)  Transportation Needs: No Transportation Needs (02/08/2024)  Utilities: Not At Risk (02/08/2024)  Social Connections: Moderately Isolated (02/08/2024)  Tobacco Use: High Risk (02/08/2024)     Readmission Risk Interventions    02/10/2024   11:24 AM 02/10/2024   11:18 AM  02/09/2024   10:31 AM  Readmission Risk Prevention Plan  Transportation Screening Complete Complete Complete  Home Care Screening Complete Complete Complete  Medication Review (RN CM) Complete Complete Complete
